# Patient Record
Sex: Male | Born: 1965 | Race: White | Hispanic: No | Marital: Married | Smoking: Former smoker
Health system: Southern US, Community
[De-identification: ages and names within clinical notes are randomized; demographics above are authoritative.]

## PROBLEM LIST (undated history)

## (undated) DIAGNOSIS — M1990 Unspecified osteoarthritis, unspecified site: Secondary | ICD-10-CM

## (undated) HISTORY — PX: EYE SURGERY: SHX253

---

## 2011-12-20 ENCOUNTER — Encounter (HOSPITAL_COMMUNITY): Payer: Self-pay | Admitting: *Deleted

## 2011-12-20 ENCOUNTER — Emergency Department (INDEPENDENT_AMBULATORY_CARE_PROVIDER_SITE_OTHER)
Admission: EM | Admit: 2011-12-20 | Discharge: 2011-12-20 | Disposition: A | Discharge status: Home / Self Care | Payer: Worker's Compensation | Source: Home / Self Care | Attending: Family Medicine | Admitting: Family Medicine

## 2011-12-20 DIAGNOSIS — IMO0002 Reserved for concepts with insufficient information to code with codable children: Secondary | ICD-10-CM

## 2011-12-20 DIAGNOSIS — Z23 Encounter for immunization: Secondary | ICD-10-CM

## 2011-12-20 DIAGNOSIS — S0993XA Unspecified injury of face, initial encounter: Secondary | ICD-10-CM

## 2011-12-20 DIAGNOSIS — T148XXA Other injury of unspecified body region, initial encounter: Secondary | ICD-10-CM

## 2011-12-20 MED ORDER — TETANUS-DIPHTH-ACELL PERTUSSIS 5-2.5-18.5 LF-MCG/0.5 IM SUSP
INTRAMUSCULAR | Status: AC
  Filled 2011-12-20: qty 0.5

## 2011-12-20 MED ORDER — BACITRACIN ZINC 500 UNIT/GM EX OINT: TOPICAL_OINTMENT | Freq: Two times a day (BID) | CUTANEOUS | Status: AC

## 2011-12-20 MED ORDER — TETANUS-DIPHTH-ACELL PERTUSSIS 5-2.5-18.5 LF-MCG/0.5 IM SUSP
0.5000 mL | Freq: Once | INTRAMUSCULAR | Status: AC
  Administered 2011-12-20: 0.5 mL via INTRAMUSCULAR

## 2011-12-20 NOTE — ED Provider Notes (Signed)
History     CSN: [id number]  Arrival date & time 12/20/11  1919   First MD Initiated Contact with Patient 12/20/11 2101      Chief Complaint  Patient presents with  . Facial Laceration    (Consider location/radiation/quality/duration/timing/severity/associated sxs/prior treatment) The history is provided by the patient.   Pt works at [employer], states while putting away items accidentally ran into a metal display, poking into left eye.  Immediate pain and bleeding.  No change in vision, states last tetanus unknown.  Not diabetic, no previous eye trauma, denies cataracts or glaucoma. No floaters or flashing lights No vision loss No blurred vision + eyelid pain, -eye pain No tearing No headache/scalp tenderness Does not wear contacts or glasses, hx of lasix surgery.  History reviewed. No pertinent past medical history.  Past Surgical History  Procedure Date  . Eye surgery     No family history on file.  History  Substance Use Topics  . Smoking status: Not on file  . Smokeless tobacco: Not on file  . Alcohol Use:       Review of Systems  Constitutional: Negative.   Eyes: Positive for pain and redness. Negative for photophobia, discharge, itching and visual disturbance.  Respiratory: Negative.   Cardiovascular: Negative.   Skin: Positive for wound. Negative for color change, pallor and rash.    Allergies  Review of patient's allergies indicates no known allergies.  Home Medications   Current Outpatient Rx  Name Route Sig Dispense Refill  . ASPIRIN 81 MG PO TABS Oral Take 81 mg by mouth daily.    Marland Kitchen BACITRACIN ZINC 500 UNIT/GM EX OINT Topical Apply topically 2 (two) times daily. 120 g 0    BP 125/91  Pulse 72  Temp 98.1 F (36.7 C) (Oral)  Resp 18  SpO2 100%  Physical Exam  Nursing note and vitals reviewed. Constitutional: He is oriented to person, place, and time. Vital signs are normal. He appears well-developed and well-nourished. He is active and  cooperative.  HENT:  Head: Normocephalic.  Eyes: Pupils are equal, round, and reactive to light. Right eye exhibits no discharge, no exudate and no hordeolum. No foreign body present in the right eye. Left eye exhibits no discharge, no exudate and no hordeolum. No foreign body present in the left eye. Right conjunctiva is not injected. Right conjunctiva has no hemorrhage. Left conjunctiva has a hemorrhage. No scleral icterus.         Less than 1/2 cm laceration to left eyelid-hemostatic, ecchymosis noted in medial canthus  Neck: Trachea normal. Neck supple.  Cardiovascular: Normal rate, regular rhythm and normal heart sounds.   Pulmonary/Chest: Effort normal and breath sounds normal.  Lymphadenopathy:    He has no cervical adenopathy.  Neurological: He is alert and oriented to person, place, and time. No cranial nerve deficit or sensory deficit.  Skin: Skin is warm and dry.  Psychiatric: He has a normal mood and affect. His speech is normal and behavior is normal. Judgment and thought content normal. Cognition and memory are normal.    ED Course  Procedures (including critical care time)  Labs Reviewed - No data to display No results found.   1. Laceration   2. Injury of eyelid       MDM  Bacitracin ointment, discussed symptoms to return sooner, return as needed.  Seek follow-up care with an ophthalmologist within 1-2 weeks if the condition is not resolved completely with prescribed management.  Johnsie Kindred, NP 12/20/11 2124

## 2011-12-20 NOTE — ED Notes (Signed)
Pt  Reports  Sustaining a  lacertion  To  l  Eye  Lid  With    Some    subcunctival  hemmorage      Present  Pt  Was  strucck  By  Metal  While  At  Work    At  [employer]  Has  Subsided     No  Loss of  concoussness

## 2011-12-20 NOTE — ED Notes (Signed)
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

## 2011-12-25 NOTE — ED Provider Notes (Signed)
Medical screening examination/treatment/procedure(s) were performed by resident physician or non-physician practitioner and as supervising physician I was immediately available for consultation/collaboration.   Barkley Bruns MD.    Linna Hoff, MD 12/25/11 1038

## 2015-04-14 ENCOUNTER — Ambulatory Visit (INDEPENDENT_AMBULATORY_CARE_PROVIDER_SITE_OTHER): Payer: BLUE CROSS/BLUE SHIELD

## 2015-04-14 ENCOUNTER — Ambulatory Visit (INDEPENDENT_AMBULATORY_CARE_PROVIDER_SITE_OTHER): Payer: BLUE CROSS/BLUE SHIELD | Admitting: Family Medicine

## 2015-04-14 VITALS — BP 132/90 | HR 93 | Temp 98.1°F | Resp 18 | Ht 64.0 in | Wt 166.4 lb

## 2015-04-14 DIAGNOSIS — M545 Low back pain, unspecified: Secondary | ICD-10-CM

## 2015-04-14 DIAGNOSIS — M546 Pain in thoracic spine: Secondary | ICD-10-CM | POA: Diagnosis not present

## 2015-04-14 DIAGNOSIS — S80869A Insect bite (nonvenomous), unspecified lower leg, initial encounter: Secondary | ICD-10-CM

## 2015-04-14 DIAGNOSIS — S80862A Insect bite (nonvenomous), left lower leg, initial encounter: Secondary | ICD-10-CM

## 2015-04-14 DIAGNOSIS — W57XXXA Bitten or stung by nonvenomous insect and other nonvenomous arthropods, initial encounter: Secondary | ICD-10-CM

## 2015-04-14 MED ORDER — TRIAMCINOLONE ACETONIDE 0.1 % EX CREA: 1.0000 "application " | TOPICAL_CREAM | Freq: Two times a day (BID) | CUTANEOUS | Status: DC

## 2015-04-14 NOTE — Patient Instructions (Addendum)
Use the triamcinolone cream twice daily on the rash. If it is not doing better over the next 10 days call back and I will call in some prednisone (cortisone) for you  Take acetaminophen (Tylenol) 500 mg 2 pills twice daily as needed for back pain.  If pain is too bad you can take Aleve (naproxen) 220 mg 2 pills twice daily but I would not recommend doing so on a regular basis. Take these with food.  Referral is being made to physical therapy  Return if needed.  Back Exercises The following exercises strengthen the muscles that help to support the back. They also help to keep the lower back flexible. Doing these exercises can help to prevent back pain or lessen existing pain. If you have back pain or discomfort, try doing these exercises 2-3 times each day or as told by your health care provider. When the pain goes away, do them once each day, but increase the number of times that you repeat the steps for each exercise (do more repetitions). If you do not have back pain or discomfort, do these exercises once each day or as told by your health care provider. EXERCISES Single Knee to Chest Repeat these steps 3-5 times for each leg: 1. Lie on your back on a firm bed or the floor with your legs extended. 2. Bring one knee to your chest. Your other leg should stay extended and in contact with the floor. 3. Hold your knee in place by grabbing your knee or thigh. 4. Pull on your knee until you feel a gentle stretch in your lower back. 5. Hold the stretch for 10-30 seconds. 6. Slowly release and straighten your leg. Pelvic Tilt Repeat these steps 5-10 times: 1. Lie on your back on a firm bed or the floor with your legs extended. 2. Bend your knees so they are pointing toward the ceiling and your feet are flat on the floor. 3. Tighten your lower abdominal muscles to press your lower back against the floor. This motion will tilt your pelvis so your tailbone points up toward the ceiling instead of  pointing to your feet or the floor. 4. With gentle tension and even breathing, hold this position for 5-10 seconds. Cat-Cow Repeat these steps until your lower back becomes more flexible: 1. Get into a hands-and-knees position on a firm surface. Keep your hands under your shoulders, and keep your knees under your hips. You may place padding under your knees for comfort. 2. Let your head hang down, and point your tailbone toward the floor so your lower back becomes rounded like the back of a cat. 3. Hold this position for 5 seconds. 4. Slowly lift your head and point your tailbone up toward the ceiling so your back forms a sagging arch like the back of a cow. 5. Hold this position for 5 seconds. Press-Ups Repeat these steps 5-10 times: 1. Lie on your abdomen (face-down) on the floor. 2. Place your palms near your head, about shoulder-width apart. 3. While you keep your back as relaxed as possible and keep your hips on the floor, slowly straighten your arms to raise the top half of your body and lift your shoulders. Do not use your back muscles to raise your upper torso. You may adjust the placement of your hands to make yourself more comfortable. 4. Hold this position for 5 seconds while you keep your back relaxed. 5. Slowly return to lying flat on the floor. Bridges Repeat these steps 10  times: 1. Lie on your back on a firm surface. 2. Bend your knees so they are pointing toward the ceiling and your feet are flat on the floor. 3. Tighten your buttocks muscles and lift your buttocks off of the floor until your waist is at almost the same height as your knees. You should feel the muscles working in your buttocks and the back of your thighs. If you do not feel these muscles, slide your feet 1-2 inches farther away from your buttocks. 4. Hold this position for 3-5 seconds. 5. Slowly lower your hips to the starting position, and allow your buttocks muscles to relax completely. If this exercise is  too easy, try doing it with your arms crossed over your chest. Abdominal Crunches Repeat these steps 5-10 times: 1. Lie on your back on a firm bed or the floor with your legs extended. 2. Bend your knees so they are pointing toward the ceiling and your feet are flat on the floor. 3. Cross your arms over your chest. 4. Tip your chin slightly toward your chest without bending your neck. 5. Tighten your abdominal muscles and slowly raise your trunk (torso) high enough to lift your shoulder blades a tiny bit off of the floor. Avoid raising your torso higher than that, because it can put too much stress on your low back and it does not help to strengthen your abdominal muscles. 6. Slowly return to your starting position. Back Lifts Repeat these steps 5-10 times: 1. Lie on your abdomen (face-down) with your arms at your sides, and rest your forehead on the floor. 2. Tighten the muscles in your legs and your buttocks. 3. Slowly lift your chest off of the floor while you keep your hips pressed to the floor. Keep the back of your head in line with the curve in your back. Your eyes should be looking at the floor. 4. Hold this position for 3-5 seconds. 5. Slowly return to your starting position. SEEK MEDICAL CARE IF:  Your back pain or discomfort gets much worse when you do an exercise.  Your back pain or discomfort does not lessen within 2 hours after you exercise. If you have any of these problems, stop doing these exercises right away. Do not do them again unless your health care provider says that you can. SEEK IMMEDIATE MEDICAL CARE IF:  You develop sudden, severe back pain. If this happens, stop doing the exercises right away. Do not do them again unless your health care provider says that you can.   This information is not intended to replace advice given to you by your health care provider. Make sure you discuss any questions you have with your health care provider.   Document Released:  05/06/2004 Document Revised: 12/18/2014 Document Reviewed: 05/23/2014 Elsevier Interactive Patient Education [employer]2016 [employer]

## 2015-04-14 NOTE — Progress Notes (Signed)
Patient ID: James Cain, male    DOB: [birth year]  Age: 50 y.o. MRN: [id number]  Chief Complaint  Patient presents with  . Back Pain    chronic  . flea bites    on left leg x 3 months (has indoor & outdoor cats)    Subjective:   Patient is here for a couple of things. He had some cats, and a mother And her kittens were using one-bedroom. At the kittens got big enough that they came out when the patient went into that room he was attacked by fleas and got 100s of bites on his lower legs. He's been itching since then it is been very slow resolving.  The second problem is a lifelong issue of back pain ever since playing sports in high school. He hurts now days up and down the spine from the neck to the pelvis. He frequently has to pop his back. He used to go to a chiropractor that his mother worked for.. He says popping makes it feel better, but then it immediately starts hurting again. He has done jobs it is listed a lot out over the years, and has had a number of instances where he is straining his back more. He takes some Tylenol and aspirin for it.  Current allergies, medications, problem list, past/family and social histories reviewed.  Objective:  BP 132/90 mmHg  Pulse 93  Temp(Src) 98.1 F (36.7 C) (Oral)  Resp 18  Ht 5\' 4"  (1.626 m)  Wt 166 lb 6 oz (75.467 kg)  BMI 28.54 kg/m2  SpO2 99%  No major acute distress. Good range of motion of his spine. Not optimally tender down his spine. Extensive flea bites, 100s of them, below his knees on both legs.  Assessment & Plan:   Assessment: 1. Midline low back pain without sciatica   2. Midline thoracic back pain   3. Flea bite of lower leg, unspecified laterality, initial encounter       Plan: Treat symptomatically. Get physical therapy. Discussed his x-rays with him. If the cream does not help his legs we will give him a taper of prednisone. [facility] reading (PRIMARY) by  Dr. Alwyn Ren Arthritic spurring of thoracic spine. Lumbar  spine and disc spaces looked pretty good..    Orders Placed This Encounter  Procedures  . DG Lumbar Spine 2-3 Views    Order Specific Question:  Reason for Exam (SYMPTOM  OR DIAGNOSIS REQUIRED)    Answer:  back pain    Order Specific Question:  Preferred imaging location?    Answer:  External  . DG Thoracic Spine 2 View    Standing Status: Future     Number of Occurrences: 1     Standing Expiration Date: 04/13/2016    Order Specific Question:  Reason for Exam (SYMPTOM  OR DIAGNOSIS REQUIRED)    Answer:  back pain    Order Specific Question:  Preferred imaging location?    Answer:  External  . Ambulatory referral to Physical Therapy    Referral Priority:  Routine    Referral Type:  Physical Medicine    Referral Reason:  Specialty Services Required    Requested Specialty:  Physical Therapy    Number of Visits Requested:  1    Meds ordered this encounter  Medications  . triamcinolone cream (KENALOG) 0.1 %    Sig: Apply 1 application topically 2 (two) times daily.    Dispense:  80 g    Refill:  0  Patient Instructions  Use the triamcinolone cream twice daily on the rash. If it is not doing better over the next 10 days call back and I will call in some prednisone (cortisone) for you  Take acetaminophen (Tylenol) 500 mg 2 pills twice daily as needed for back pain.  If pain is too bad you can take Aleve (naproxen) 220 mg 2 pills twice daily but I would not recommend doing so on a regular basis. Take these with food.  Referral is being made to physical therapy  Return if needed.  Back Exercises The following exercises strengthen the muscles that help to support the back. They also help to keep the lower back flexible. Doing these exercises can help to prevent back pain or lessen existing pain. If you have back pain or discomfort, try doing these exercises 2-3 times each day or as told by your health care provider. When the pain goes away, do them once each day, but  increase the number of times that you repeat the steps for each exercise (do more repetitions). If you do not have back pain or discomfort, do these exercises once each day or as told by your health care provider. EXERCISES Single Knee to Chest Repeat these steps 3-5 times for each leg: 1. Lie on your back on a firm bed or the floor with your legs extended. 2. Bring one knee to your chest. Your other leg should stay extended and in contact with the floor. 3. Hold your knee in place by grabbing your knee or thigh. 4. Pull on your knee until you feel a gentle stretch in your lower back. 5. Hold the stretch for 10-30 seconds. 6. Slowly release and straighten your leg. Pelvic Tilt Repeat these steps 5-10 times: 1. Lie on your back on a firm bed or the floor with your legs extended. 2. Bend your knees so they are pointing toward the ceiling and your feet are flat on the floor. 3. Tighten your lower abdominal muscles to press your lower back against the floor. This motion will tilt your pelvis so your tailbone points up toward the ceiling instead of pointing to your feet or the floor. 4. With gentle tension and even breathing, hold this position for 5-10 seconds. Cat-Cow Repeat these steps until your lower back becomes more flexible: 1. Get into a hands-and-knees position on a firm surface. Keep your hands under your shoulders, and keep your knees under your hips. You may place padding under your knees for comfort. 2. Let your head hang down, and point your tailbone toward the floor so your lower back becomes rounded like the back of a cat. 3. Hold this position for 5 seconds. 4. Slowly lift your head and point your tailbone up toward the ceiling so your back forms a sagging arch like the back of a cow. 5. Hold this position for 5 seconds. Press-Ups Repeat these steps 5-10 times: 1. Lie on your abdomen (face-down) on the floor. 2. Place your palms near your head, about shoulder-width  apart. 3. While you keep your back as relaxed as possible and keep your hips on the floor, slowly straighten your arms to raise the top half of your body and lift your shoulders. Do not use your back muscles to raise your upper torso. You may adjust the placement of your hands to make yourself more comfortable. 4. Hold this position for 5 seconds while you keep your back relaxed. 5. Slowly return to lying flat on the floor. Henreitta LeberBridges  Repeat these steps 10 times: 1. Lie on your back on a firm surface. 2. Bend your knees so they are pointing toward the ceiling and your feet are flat on the floor. 3. Tighten your buttocks muscles and lift your buttocks off of the floor until your waist is at almost the same height as your knees. You should feel the muscles working in your buttocks and the back of your thighs. If you do not feel these muscles, slide your feet 1-2 inches farther away from your buttocks. 4. Hold this position for 3-5 seconds. 5. Slowly lower your hips to the starting position, and allow your buttocks muscles to relax completely. If this exercise is too easy, try doing it with your arms crossed over your chest. Abdominal Crunches Repeat these steps 5-10 times: 1. Lie on your back on a firm bed or the floor with your legs extended. 2. Bend your knees so they are pointing toward the ceiling and your feet are flat on the floor. 3. Cross your arms over your chest. 4. Tip your chin slightly toward your chest without bending your neck. 5. Tighten your abdominal muscles and slowly raise your trunk (torso) high enough to lift your shoulder blades a tiny bit off of the floor. Avoid raising your torso higher than that, because it can put too much stress on your low back and it does not help to strengthen your abdominal muscles. 6. Slowly return to your starting position. Back Lifts Repeat these steps 5-10 times: 1. Lie on your abdomen (face-down) with your arms at your sides, and rest your  forehead on the floor. 2. Tighten the muscles in your legs and your buttocks. 3. Slowly lift your chest off of the floor while you keep your hips pressed to the floor. Keep the back of your head in line with the curve in your back. Your eyes should be looking at the floor. 4. Hold this position for 3-5 seconds. 5. Slowly return to your starting position. SEEK MEDICAL CARE IF:  Your back pain or discomfort gets much worse when you do an exercise.  Your back pain or discomfort does not lessen within 2 hours after you exercise. If you have any of these problems, stop doing these exercises right away. Do not do them again unless your health care provider says that you can. SEEK IMMEDIATE MEDICAL CARE IF:  You develop sudden, severe back pain. If this happens, stop doing the exercises right away. Do not do them again unless your health care provider says that you can.   This information is not intended to replace advice given to you by your health care provider. Make sure you discuss any questions you have with your health care provider.   Document Released: 05/06/2004 Document Revised: 12/18/2014 Document Reviewed: 05/23/2014 Elsevier Interactive Patient Education [employer]      Return if symptoms worsen or fail to improve.   HOPPER,DAVID, MD 04/14/2015

## 2015-04-15 ENCOUNTER — Encounter: Payer: Self-pay | Admitting: Family Medicine

## 2015-04-19 ENCOUNTER — Encounter: Payer: Self-pay | Admitting: Family Medicine

## 2015-05-02 ENCOUNTER — Encounter: Payer: Self-pay | Admitting: *Deleted

## 2015-05-23 ENCOUNTER — Ambulatory Visit: Payer: BLUE CROSS/BLUE SHIELD | Admitting: Physical Therapy

## 2015-12-30 ENCOUNTER — Emergency Department (HOSPITAL_COMMUNITY)
Admission: EM | Admit: 2015-12-30 | Discharge: 2015-12-30 | Disposition: A | Discharge status: Home / Self Care | Payer: Worker's Compensation | Attending: Emergency Medicine | Admitting: Emergency Medicine

## 2015-12-30 ENCOUNTER — Encounter (HOSPITAL_COMMUNITY): Payer: Self-pay | Admitting: Emergency Medicine

## 2015-12-30 ENCOUNTER — Emergency Department (HOSPITAL_COMMUNITY): Payer: Worker's Compensation

## 2015-12-30 DIAGNOSIS — W11XXXA Fall on and from ladder, initial encounter: Secondary | ICD-10-CM | POA: Insufficient documentation

## 2015-12-30 DIAGNOSIS — Z87891 Personal history of nicotine dependence: Secondary | ICD-10-CM | POA: Insufficient documentation

## 2015-12-30 DIAGNOSIS — S52502A Unspecified fracture of the lower end of left radius, initial encounter for closed fracture: Secondary | ICD-10-CM

## 2015-12-30 DIAGNOSIS — S52572A Other intraarticular fracture of lower end of left radius, initial encounter for closed fracture: Secondary | ICD-10-CM | POA: Diagnosis not present

## 2015-12-30 DIAGNOSIS — S52042A Displaced fracture of coronoid process of left ulna, initial encounter for closed fracture: Secondary | ICD-10-CM | POA: Insufficient documentation

## 2015-12-30 DIAGNOSIS — Y999 Unspecified external cause status: Secondary | ICD-10-CM | POA: Insufficient documentation

## 2015-12-30 DIAGNOSIS — Z79899 Other long term (current) drug therapy: Secondary | ICD-10-CM | POA: Diagnosis not present

## 2015-12-30 DIAGNOSIS — Y9389 Activity, other specified: Secondary | ICD-10-CM | POA: Diagnosis not present

## 2015-12-30 DIAGNOSIS — Y929 Unspecified place or not applicable: Secondary | ICD-10-CM | POA: Diagnosis not present

## 2015-12-30 DIAGNOSIS — S6992XA Unspecified injury of left wrist, hand and finger(s), initial encounter: Secondary | ICD-10-CM | POA: Diagnosis present

## 2015-12-30 DIAGNOSIS — W19XXXA Unspecified fall, initial encounter: Secondary | ICD-10-CM

## 2015-12-30 HISTORY — DX: Unspecified osteoarthritis, unspecified site: M19.90

## 2015-12-30 MED ORDER — MORPHINE SULFATE (PF) 4 MG/ML IV SOLN
4.0000 mg | Freq: Once | INTRAVENOUS | Status: AC
  Administered 2015-12-30: 4 mg via INTRAVENOUS
  Filled 2015-12-30: qty 1

## 2015-12-30 MED ORDER — OXYCODONE-ACETAMINOPHEN 5-325 MG PO TABS: 1.0000 | ORAL_TABLET | ORAL | 0 refills | Status: DC | PRN

## 2015-12-30 NOTE — ED Notes (Signed)
Called ortho for splint placement 

## 2015-12-30 NOTE — ED Triage Notes (Signed)
Per EMS, patient fell from 5 ft step ladder. Patient is complaining of left arm pain. Denies hitting head. Patient received 100 mcg of fentanyl. Patient's pain is now 8/10

## 2015-12-30 NOTE — ED Notes (Signed)
Ortho tech at bedside 

## 2015-12-30 NOTE — Discharge Instructions (Signed)
Your xrays show a  1. Comminuted and mildly displaced intra-articular fracture of the coronoid process region of the ulna. 2. Comminuted intra-articular fracture involving the distal radius.  We have placed you in a splint.  Please follow up with hand surgery.  You may take Percocet every 4-6 hours as needed for pain.  You may also take motrin.  Return if you experience numbness, color changes, increased pain, or any new symptoms.

## 2015-12-30 NOTE — ED Notes (Signed)
Patient transported to X-ray 

## 2015-12-30 NOTE — ED Notes (Signed)
[facility] Expected date:  Expected time:  Means of arrival:  Comments: EMS 50 yo, m fall

## 2015-12-30 NOTE — ED Notes (Signed)
Awaiting response from worker's comp.

## 2015-12-30 NOTE — ED Provider Notes (Signed)
[facility]-EMERGENCY DEPT Provider Note   CSN: [id number] Arrival date & time: 12/30/15  1739     History   Chief Complaint Chief Complaint  Patient presents with  . Fall  . Arm Pain    HPI James Cain is a 50 y.o. male.  HPI   James Cain is a 50 y.o. male with PMH significant for arthritis who presents with mechanical fall just PTA and now left elbow and wrist pain and swelling. Patient states he was standing on a 5 ft step ladder when he lost his balance and fell landing on his left arm.  Denies head injury or LOC.  Denies neck pain or pain elsewhere.  He is not anticoagulated.  He received 100 mg Fentanyl with some relief of pain.  No numbness or weakness.    Past Medical History:  Diagnosis Date  . Arthritis     There are no active problems to display for this patient.   Past Surgical History:  Procedure Laterality Date  . EYE SURGERY         Home Medications    Prior to Admission medications   Medication Sig Start Date End Date Taking? Authorizing Provider  acetaminophen (TYLENOL) 500 MG tablet Take 1,000 mg by mouth 2 (two) times daily.   Yes Historical Provider, MD  loratadine (CLARITIN) 10 MG tablet Take 10 mg by mouth daily.   Yes Historical Provider, MD  Misc Natural Products (GLUCOSAMINE CHOND DOUBLE STR) TABS Take 1 tablet by mouth daily.   Yes Historical Provider, MD  Multiple Vitamins-Minerals (MULTIVITAMIN ADULT PO) Take 1 tablet by mouth daily.   Yes Historical Provider, MD  oxyCODONE-acetaminophen (PERCOCET/ROXICET) 5-325 MG tablet Take 1-2 tablets by mouth every 4 (four) hours as needed for severe pain. 12/30/15   Cheri Fowler, PA-C  triamcinolone cream (KENALOG) 0.1 % Apply 1 application topically 2 (two) times daily. Patient not taking: Reported on 12/30/2015 04/14/15   Peyton Najjar, MD    Family History Family History  Problem Relation Age of Onset  . Stroke Mother   . Heart disease Paternal Grandmother   . Hyperlipidemia Paternal  Grandmother     Social History Social History  Substance Use Topics  . Smoking status: Former Games developer  . Smokeless tobacco: Never Used  . Alcohol use No     Allergies   Review of patient's allergies indicates no known allergies.   Review of Systems Review of Systems All other systems negative unless otherwise stated in HPI   Physical Exam Updated Vital Signs BP 116/79 (BP Location: Right Arm)   Pulse 79   Temp 98.1 F (36.7 C) (Oral)   Resp 18   Ht 5\' 9"  (1.753 m)   Wt 75.3 kg   SpO2 98%   BMI 24.51 kg/m   Physical Exam  Constitutional: He is oriented to person, place, and time. He appears well-developed and well-nourished.  Non-toxic appearance. He does not have a sickly appearance. He does not appear ill.  HENT:  Head: Normocephalic and atraumatic.  Mouth/Throat: Oropharynx is clear and moist.  No signs of trauma.   Eyes: Conjunctivae are normal. Pupils are equal, round, and reactive to light.  Neck: Normal range of motion. Neck supple.  No cervical midline tenderness.   Cardiovascular: Normal rate and regular rhythm.   Pulmonary/Chest: Effort normal and breath sounds normal. No accessory muscle usage or stridor. No respiratory distress. He has no wheezes. He has no rhonchi. He has no rales.  Abdominal: Soft.  Bowel sounds are normal. He exhibits no distension. There is no tenderness.  Musculoskeletal: He exhibits tenderness.       Left shoulder: Normal.       Left elbow: He exhibits decreased range of motion (in flexion) and swelling. He exhibits no deformity. Tenderness found. No medial epicondyle, no lateral epicondyle and no olecranon process tenderness noted.       Left wrist: He exhibits tenderness and swelling. He exhibits normal range of motion, no bony tenderness and no deformity.  No t/l/s midline tenderness. Compartments are soft and compressible.  Lymphadenopathy:    He has no cervical adenopathy.  Neurological: He is alert and oriented to person,  place, and time.  PMS intact.   Skin: Skin is warm and dry.  Psychiatric: He has a normal mood and affect. His behavior is normal.     ED Treatments / Results  Labs (all labs ordered are listed, but only abnormal results are displayed) Labs Reviewed - No data to display  EKG  EKG Interpretation None       Radiology Dg Elbow Complete Left  Result Date: 12/30/2015 CLINICAL DATA:  Larey SeatFell off of a step ladder today. Injured left all arm. EXAM: LEFT ELBOW - COMPLETE 3+ VIEW; LEFT WRIST - COMPLETE 3+ VIEW COMPARISON:  None. FINDINGS: Left elbow: There is a comminuted and mildly displaced intra-articular fracture involving the coronoid process region of the ulna. This is likely an impaction injury from the trochlea. The capitellum at radial head are intact. No olecranon fracture. No dislocation. Left wrist: There is a comminuted intra-articular fracture involving the distal radius. There are horizontal and vertical components. The ulna is intact. No carpal bone fractures are identified. The metacarpals are intact. IMPRESSION: 1. Comminuted and mildly displaced intra-articular fracture of the coronoid process region of the ulna. 2. Comminuted intra-articular fracture involving the distal radius. Electronically Signed   By: Rudie MeyerP.  Gallerani M.D.   On: 12/30/2015 19:03   Dg Wrist Complete Left  Result Date: 12/30/2015 CLINICAL DATA:  Larey SeatFell off of a step ladder today. Injured left all arm. EXAM: LEFT ELBOW - COMPLETE 3+ VIEW; LEFT WRIST - COMPLETE 3+ VIEW COMPARISON:  None. FINDINGS: Left elbow: There is a comminuted and mildly displaced intra-articular fracture involving the coronoid process region of the ulna. This is likely an impaction injury from the trochlea. The capitellum at radial head are intact. No olecranon fracture. No dislocation. Left wrist: There is a comminuted intra-articular fracture involving the distal radius. There are horizontal and vertical components. The ulna is intact. No carpal  bone fractures are identified. The metacarpals are intact. IMPRESSION: 1. Comminuted and mildly displaced intra-articular fracture of the coronoid process region of the ulna. 2. Comminuted intra-articular fracture involving the distal radius. Electronically Signed   By: Rudie MeyerP.  Gallerani M.D.   On: 12/30/2015 19:03    Procedures Procedures (including critical care time)  Medications Ordered in ED Medications  morphine 4 MG/ML injection 4 mg (4 mg Intravenous Given 12/30/15 1853)     Initial Impression / Assessment and Plan / ED Course  I have reviewed the triage vital signs and the nursing notes.  Pertinent labs & imaging results that were available during my care of the patient were reviewed by me and considered in my medical decision making (see chart for details).  Clinical Course   Patient presents with mechanical fall, landing on his left elbow and wrist. No head injury or loss of consciousness. He denies any numbness or weakness. On  exam, swelling about the left elbow and left wrist. No obvious deformities.He is neurovascularly intact. Compartments are soft and compressible.  Plain films showed a coronoid process of the ulna as well as distal radius fracture. Patient was placed in a sugar tong splint and discharged home with Percocet along with hand surgery follow-up. Return precautions discussed. Patient agrees and acknowledges the above plan for discharge.  Case has been discussed with Dr. Clydene Pugh who agrees with the above plan for discharge.    Final Clinical Impressions(s) / ED Diagnoses   Final diagnoses:  Fall, initial encounter  Fracture of ulna, coronoid process, left, closed, initial encounter  Distal radial fracture, left, closed, initial encounter    New Prescriptions New Prescriptions   OXYCODONE-ACETAMINOPHEN (PERCOCET/ROXICET) 5-325 MG TABLET    Take 1-2 tablets by mouth every 4 (four) hours as needed for severe pain.     Cheri Fowler, PA-C 12/30/15 2059    Lyndal Pulley, MD 12/31/15 [phone]

## 2016-07-17 ENCOUNTER — Ambulatory Visit: Payer: Managed Care, Other (non HMO)

## 2016-07-24 ENCOUNTER — Ambulatory Visit (INDEPENDENT_AMBULATORY_CARE_PROVIDER_SITE_OTHER): Payer: BLUE CROSS/BLUE SHIELD | Admitting: Family Medicine

## 2016-07-24 VITALS — BP 138/94 | HR 90 | Temp 98.6°F | Resp 18 | Ht 64.17 in | Wt 162.6 lb

## 2016-07-24 DIAGNOSIS — W57XXXA Bitten or stung by nonvenomous insect and other nonvenomous arthropods, initial encounter: Secondary | ICD-10-CM | POA: Diagnosis not present

## 2016-07-24 DIAGNOSIS — M545 Low back pain, unspecified: Secondary | ICD-10-CM

## 2016-07-24 DIAGNOSIS — G8929 Other chronic pain: Secondary | ICD-10-CM

## 2016-07-24 DIAGNOSIS — I872 Venous insufficiency (chronic) (peripheral): Secondary | ICD-10-CM | POA: Diagnosis not present

## 2016-07-24 DIAGNOSIS — S80869A Insect bite (nonvenomous), unspecified lower leg, initial encounter: Secondary | ICD-10-CM | POA: Diagnosis not present

## 2016-07-24 DIAGNOSIS — Z0000 Encounter for general adult medical examination without abnormal findings: Secondary | ICD-10-CM

## 2016-07-24 LAB — POCT URINALYSIS DIP (MANUAL ENTRY)
BILIRUBIN UA: NEGATIVE mg/dL
Bilirubin, UA: NEGATIVE
Glucose, UA: NEGATIVE mg/dL
Leukocytes, UA: NEGATIVE
Nitrite, UA: NEGATIVE
PH UA: 5 (ref 5.0–8.0)
PROTEIN UA: NEGATIVE mg/dL
RBC UA: NEGATIVE
SPEC GRAV UA: 1.015 (ref 1.010–1.025)
UROBILINOGEN UA: 0.2 U/dL

## 2016-07-24 LAB — POC HEMOCCULT BLD/STL (OFFICE/1-CARD/DIAGNOSTIC): Fecal Occult Blood, POC: NEGATIVE

## 2016-07-24 MED ORDER — TRIAMCINOLONE ACETONIDE 0.1 % EX CREA: 1.0000 "application " | TOPICAL_CREAM | Freq: Two times a day (BID) | CUTANEOUS | 1 refills | Status: DC

## 2016-07-24 NOTE — Addendum Note (Signed)
Addended by: Baldwin Crown D on: 07/24/2016 11:03 AM   Modules accepted: Orders

## 2016-07-24 NOTE — Progress Notes (Signed)
Patient ID: James Cain, male    DOB: [birth year]  Age: 51 y.o. MRN: [id number]  Chief Complaint  Patient presents with  . Annual Exam    Subjective:   51 year old man here for his annual physical examination. He just felt like it was time for an examination.  Past history: Generally has been healthy. No major illnesses or operations. Allergies: None Regular medications: Takes OTC acetaminophen for his back when necessary  Family history: Parents are living. Mother has had some heart issues, follow healthy. No major foraminal diseases.   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 He is married. He had a first wife, then a girlfriend in between and then this wife for many years. No sexual activity outside the context of marriage. Is been a number of years ago and he got married that he got tested and feels like he has no risk of STDs. He did have a broken arm last year and that limited him so that he is now working regularly again in the warehouse. He does have chronic problems with back hurting him which limits his work a little bit and he would like to be doing something differently. 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  Review of systems: Constitutional: Unremarkable HEENT: Unremarkable Cardiovascular: Unremarkable Respiratory: Unremarkable Gastrointestinal: Unremarkable Genitourinary: Unremarkable No sexual problems Dermatologic: Still gets a little rash on his ankles which he uses some-year-old cream on times. Neurologic: Gets a little numbness in his left arm still emesis injury. Psychiatric: Unremarkable         Current allergies, medications, problem list, past/family and social histories reviewed.  Objective:  BP (!) 138/94   Pulse 90   Temp 98.6 F (37 C) (Oral)   Resp 18   Ht 5' 4.17" (1.63 m)    Wt 162 lb 9.6 oz (73.8 kg)   SpO2 95%   BMI 27.76 kg/m   Pleasant man in no acute distress. HEENT: Normal. TMs normal. He says he cleans his ears compulsively all the time. Eyes PERRLA. Fundi benign. Throat clear. Neck supple without nodes or thyromegaly. No carotid bruits. Chest is clear to auscultation. Heart regular without murmurs gallops or arrhythmias. Abdomen soft without organomegaly masses or tenderness. Normal male external genitalia with testes descended. No hernias. Digital rectal exam his prostate gland be normal in shape and contour with no lesions. Extremities unremarkable. Skin on the unremarkable except for little stasis dermatitis appearance on his medial aspects of both ankles. He does have some rather large tattoos on both arms.  Assessment & Plan:   Assessment: 1. Annual physical exam   2. Chronic low back pain without sciatica, unspecified back pain laterality   3. Stasis dermatitis of both legs   4. Flea bite of lower leg, unspecified laterality, initial encounter       Plan: Check basic labs. Discussed pros and cons of PSA testing and he decided to get it. Does not feel like he needs to be tested for STDs.  Orders Placed This Encounter  Procedures  . COMPLETE METABOLIC PANEL WITH GFR  . Lipid panel  . CBC  . PSA  . Hepatitis C Ab Reflex HCV RNA, QUANT  . TSH  . POCT urinalysis dipstick  . POC Hemoccult Bld/Stl (1-Cd Office Dx)    Meds ordered this encounter  Medications  .  triamcinolone cream (KENALOG) 0.1 %    Sig: Apply 1 application topically 2 (two) times daily.    Dispense:  80 g    Refill:  1         Patient Instructions   Continue to try to get regular exercise for your back and arm. If the arm numbness gets worse get rechecked.  You can safely use the cream, triamcinolone, on the ankles if needed. If the rash is getting worse or more irritating we will send you to a dermatologist.  Recommend getting a physical about  annually.  Return as needed    IF you received an x-ray today, you will receive an invoice from [facility]. Please contact [facility] at [phone] with questions or concerns regarding your invoice.   IF you received labwork today, you will receive an invoice from [place]. Please contact [facility] at [phone] with questions or concerns regarding your invoice.   Our billing staff will not be able to assist you with questions regarding bills from these companies.  You will be contacted with the lab results as soon as they are available. The fastest way to get your results is to activate your My Chart account. Instructions are located on the last page of this paperwork. If you have not heard from [place] regarding the results in 2 weeks, please contact this office.         Return in about 1 year (around 07/24/2017).   HOPPER,DAVID, MD 07/24/2016

## 2016-07-24 NOTE — Patient Instructions (Addendum)
Continue to try to get regular exercise for your back and arm. If the arm numbness gets worse get rechecked.  You can safely use the cream, triamcinolone, on the ankles if needed. If the rash is getting worse or more irritating we will send you to a dermatologist.  Recommend getting a physical about annually.  Return as needed    IF you received an x-ray today, you will receive an invoice from [facility]. Please contact [facility] at [phone] with questions or concerns regarding your invoice.   IF you received labwork today, you will receive an invoice from [place]. Please contact [facility] at [phone] with questions or concerns regarding your invoice.   Our billing staff will not be able to assist you with questions regarding bills from these companies.  You will be contacted with the lab results as soon as they are available. The fastest way to get your results is to activate your My Chart account. Instructions are located on the last page of this paperwork. If you have not heard from [place] regarding the results in 2 weeks, please contact this office.

## 2016-07-25 LAB — LIPID PANEL
CHOL/HDL RATIO: 4.8 ratio (ref 0.0–5.0)
CHOLESTEROL TOTAL: 197 mg/dL (ref 100–199)
HDL: 41 mg/dL (ref >39)
LDL Calculated: 117 mg/dL — ABNORMAL HIGH (ref 0–99)
Triglycerides: 195 mg/dL — ABNORMAL HIGH (ref 0–149)
VLDL Cholesterol Cal: 39 mg/dL (ref 5–40)

## 2016-07-25 LAB — CBC
HEMATOCRIT: 42.7 % (ref 37.5–51.0)
HEMOGLOBIN: 14.5 g/dL (ref 13.0–17.7)
MCH: 30.1 pg (ref 26.6–33.0)
MCHC: 34 g/dL (ref 31.5–35.7)
MCV: 89 fL (ref 79–97)
Platelets: 224 10*3/uL (ref 150–379)
RBC: 4.81 x10E6/uL (ref 4.14–5.80)
RDW: 14.1 % (ref 12.3–15.4)
WBC: 6.4 10*3/uL (ref 3.4–10.8)

## 2016-07-25 LAB — TSH: TSH: 1.7 u[IU]/mL (ref 0.450–4.500)

## 2016-07-25 LAB — PSA: Prostate Specific Ag, Serum: 1.8 ng/mL (ref 0.0–4.0)

## 2016-08-03 LAB — COMPREHENSIVE METABOLIC PANEL

## 2016-08-03 LAB — HCV REALTIME ABBOTT

## 2016-08-24 ENCOUNTER — Telehealth: Payer: Self-pay

## 2016-08-24 DIAGNOSIS — Z0000 Encounter for general adult medical examination without abnormal findings: Secondary | ICD-10-CM

## 2016-08-24 NOTE — Telephone Encounter (Signed)
PATIENT STATES HE HAD HIS ANNUAL PHYSICAL DONE WITH DR. HOPPER ON July 24, 2016 AND HE HAS NEVER GOTTEN HIS LAB RESULTS. HE SAID HE CALLED AND LEFT A MESSAGE THIS PAST Saturday (08/21/16) BUT I DIDN'T SEE ONE IN HIS CHART. PLEASE CALL HIM WITH THOSE RESULTS. BEST PHONE [phone] (HOME) PHARMACY CHOICE IS [facility] ON [address]. MBC

## 2016-08-25 NOTE — Telephone Encounter (Signed)
Dr. Alwyn RenHopper, Please post results

## 2016-08-27 NOTE — Telephone Encounter (Signed)
Please call patient back.   My apologies that this slipped by me.      Labs were all good except for a mild elevation of the triglycerides, which do not require any major attention.  Avoid excessive fried and fatty foods, and try to get regular exercise.  Please send him a copy of things.   However, it appears that somehow the CMP did not get run.  Please schedule it to be run on a lab only visit sometime at his convenience.  It appears to have been an error on our part, and I think he should be able to come get it drawn without having to pay a co-pay if at all possible.

## 2016-08-27 NOTE — Telephone Encounter (Signed)
Pt advised.

## 2016-09-11 ENCOUNTER — Other Ambulatory Visit: Payer: BLUE CROSS/BLUE SHIELD | Admitting: Family Medicine

## 2016-09-11 DIAGNOSIS — Z0000 Encounter for general adult medical examination without abnormal findings: Secondary | ICD-10-CM

## 2016-09-12 LAB — CMP14+EGFR
A/G RATIO: 2 (ref 1.2–2.2)
ALK PHOS: 54 IU/L (ref 39–117)
ALT: 30 IU/L (ref 0–44)
AST: 25 IU/L (ref 0–40)
Albumin: 4.7 g/dL (ref 3.5–5.5)
BILIRUBIN TOTAL: 0.2 mg/dL (ref 0.0–1.2)
BUN/Creatinine Ratio: 26 — ABNORMAL HIGH (ref 9–20)
BUN: 21 mg/dL (ref 6–24)
CALCIUM: 9.6 mg/dL (ref 8.7–10.2)
CHLORIDE: 104 mmol/L (ref 96–106)
CO2: 23 mmol/L (ref 18–29)
Creatinine, Ser: 0.82 mg/dL (ref 0.76–1.27)
GFR calc Af Amer: 118 mL/min/{1.73_m2} (ref >59)
GFR, EST NON AFRICAN AMERICAN: 102 mL/min/{1.73_m2} (ref >59)
GLOBULIN, TOTAL: 2.3 g/dL (ref 1.5–4.5)
Glucose: 89 mg/dL (ref 65–99)
POTASSIUM: 4.6 mmol/L (ref 3.5–5.2)
SODIUM: 143 mmol/L (ref 134–144)
Total Protein: 7 g/dL (ref 6.0–8.5)

## 2016-09-13 NOTE — Progress Notes (Signed)
Lab only 

## 2016-11-09 ENCOUNTER — Ambulatory Visit (INDEPENDENT_AMBULATORY_CARE_PROVIDER_SITE_OTHER): Payer: BLUE CROSS/BLUE SHIELD

## 2016-11-09 ENCOUNTER — Encounter: Payer: Self-pay | Admitting: Family Medicine

## 2016-11-09 ENCOUNTER — Ambulatory Visit (INDEPENDENT_AMBULATORY_CARE_PROVIDER_SITE_OTHER): Payer: BLUE CROSS/BLUE SHIELD | Admitting: Family Medicine

## 2016-11-09 VITALS — BP 127/86 | HR 92 | Temp 98.2°F | Resp 18 | Ht 64.0 in | Wt 160.0 lb

## 2016-11-09 DIAGNOSIS — M545 Low back pain, unspecified: Secondary | ICD-10-CM

## 2016-11-09 DIAGNOSIS — M542 Cervicalgia: Secondary | ICD-10-CM | POA: Diagnosis not present

## 2016-11-09 DIAGNOSIS — S39012A Strain of muscle, fascia and tendon of lower back, initial encounter: Secondary | ICD-10-CM

## 2016-11-09 DIAGNOSIS — M549 Dorsalgia, unspecified: Secondary | ICD-10-CM

## 2016-11-09 DIAGNOSIS — G8929 Other chronic pain: Secondary | ICD-10-CM

## 2016-11-09 MED ORDER — MELOXICAM 7.5 MG PO TABS: 7.5000 mg | ORAL_TABLET | Freq: Every day | ORAL | 0 refills | Status: DC

## 2016-11-09 MED ORDER — CYCLOBENZAPRINE HCL 5 MG PO TABS: ORAL_TABLET | ORAL | 0 refills | Status: DC

## 2016-11-09 NOTE — Patient Instructions (Addendum)
You likely have a sprained ligament or strained muscle in the low back, which can lead to some muscle spasm as well. Try the mobic each morning (do not combine with other over the counter pain relievers), flexeril up to 3 times per day, but start at night if needed. Heat or ice to area as needed. I did provide a note for your work from yesterday through tomorrow, returning on Thursday. I did refer you to orthopedics to discuss physical therapy or other treatment for your back pain. If you do require FMLA paperwork, we can discuss details for that paperwork.   Return to the clinic or go to the nearest emergency room if any of your symptoms worsen or new symptoms occur.   Back Pain, Adult Back pain is very common in adults.The cause of back pain is rarely dangerous and the pain often gets better over time.The cause of your back pain may not be known. Some common causes of back pain include:  Strain of the muscles or ligaments supporting the spine.  Wear and tear (degeneration) of the spinal disks.  Arthritis.  Direct injury to the back.  For many people, back pain may return. Since back pain is rarely dangerous, most people can learn to manage this condition on their own. Follow these instructions at home: Watch your back pain for any changes. The following actions may help to lessen any discomfort you are feeling:  Remain active. It is stressful on your back to sit or stand in one place for long periods of time. Do not sit, drive, or stand in one place for more than 30 minutes at a time. Take short walks on even surfaces as soon as you are able.Try to increase the length of time you walk each day.  Exercise regularly as directed by your health care provider. Exercise helps your back heal faster. It also helps avoid future injury by keeping your muscles strong and flexible.  Do not stay in bed.Resting more than 1-2 days can delay your recovery.  Pay attention to your body when you bend  and lift. The most comfortable positions are those that put less stress on your recovering back. Always use proper lifting techniques, including: ? Bending your knees. ? Keeping the load close to your body. ? Avoiding twisting.  Find a comfortable position to sleep. Use a firm mattress and lie on your side with your knees slightly bent. If you lie on your back, put a pillow under your knees.  Avoid feeling anxious or stressed.Stress increases muscle tension and can worsen back pain.It is important to recognize when you are anxious or stressed and learn ways to manage it, such as with exercise.  Take medicines only as directed by your health care provider. Over-the-counter medicines to reduce pain and inflammation are often the most helpful.Your health care provider may prescribe muscle relaxant drugs.These medicines help dull your pain so you can more quickly return to your normal activities and healthy exercise.  Apply ice to the injured area: ? Put ice in a plastic bag. ? Place a towel between your skin and the bag. ? Leave the ice on for 20 minutes, 2-3 times a day for the first 2-3 days. After that, ice and heat may be alternated to reduce pain and spasms.  Maintain a healthy weight. Excess weight puts extra stress on your back and makes it difficult to maintain good posture.  Contact a health care provider if:  You have pain that is not relieved  with rest or medicine.  You have increasing pain going down into the legs or buttocks.  You have pain that does not improve in one week.  You have night pain.  You lose weight.  You have a fever or chills. Get help right away if:  You develop new bowel or bladder control problems.  You have unusual weakness or numbness in your arms or legs.  You develop nausea or vomiting.  You develop abdominal pain.  You feel faint. This information is not intended to replace advice given to you by your health care provider. Make sure you  discuss any questions you have with your health care provider. Document Released: 03/29/2005 Document Revised: 08/07/2015 Document Reviewed: 07/31/2013 Elsevier Interactive Patient Education  2017 [employer]  IF you received an x-ray today, you will receive an invoice from [facility]. Please contact [facility] at [phone] with questions or concerns regarding your invoice.   IF you received labwork today, you will receive an invoice from [facility]. Please contact [facility] at [phone] with questions or concerns regarding your invoice.   Our billing staff will not be able to assist you with questions regarding bills from these companies.  You will be contacted with the lab results as soon as they are available. The fastest way to get your results is to activate your My Chart account. Instructions are located on the last page of this paperwork. If you have not heard from us regarding the results in 2 weeks, please contact this office.

## 2016-11-09 NOTE — Progress Notes (Signed)
Subjective:  By signing my name below, I, James Cain, attest that this documentation has been prepared under the direction and in the presence of James StaggersJeffrey Shilee Biggs, MD. Electronically Signed: Stann Oresung-Kai Cain, Scribe. 11/09/2016 , 5:37 PM .  Patient was seen in Room 10 .   Patient ID: James Cain Cain, male    DOB: 04-22-1965, 51 y.o.   MRN: [id number] Chief Complaint  Patient presents with  . Back Pain    left side lower    HPI James Cain is a 51 y.o. male  Patient complains of left sided low back pain. He was most recently seen by Dr. Alwyn RenHopper in April for a physical exam. Per note, he had chronic problems with back pain; he was recommended to continue his regular exercise for back pain management. Prior, he was seen in Jan 2017 for back pain. Per review of Jan 2017 note, he had back pain ever since playing sports in high school and occasionally has to pop his back. He had previous treatments with chiropractor. He was treated with tylenol and aleve at that visit. Physical therapy referral was placed. He had thoracic and lumbar spine xray in Jan 2017, which showed areas of osteoarthritic change at multiple levels of T-spine, mild lumbar spondylosis and minimal anterior wedging of L3 vertebrae.   Patient reports his parents were into chiropracting and he learned to "self crack", where he would bend his back against flat surfaces, up to 10 times a day. 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   He complains turning a certain way 5 days ago and felt a pulling sensation in his left lower back. He was able to go to work after wearing a back brace. Over the weekend (2-3 days ago), he's been self treating the area with ice and heat. He was out of work yesterday and today. He denies pain radiating down his legs or to his groin. He denies bowel or urinary incontinence, or saddle anesthesia. He's been taking extra strength tylenol 1000mg   2-3x a day. He hasn't done any home exercises that Dr. Alwyn RenHopper printed out for him. He denies having problems with muscle relaxants in the past.   At the end of visit, he reports also having neck issues and neck pain that has been ongoing for a while He describes neck issues since he had left arm issues. He was given left arm home exercises and with it, his left shoulder became sore and overused. Since then, his neck has gotten really stiff and sore. Notices pain move up his back to his neck or vice versa when one area is sore.  There are no active problems to display for this patient.  Past Medical History:  Diagnosis Date  . Arthritis    Past Surgical History:  Procedure Laterality Date  . EYE SURGERY     No Known Allergies Prior to Admission medications   Medication Sig Start Date End Date Taking? Authorizing Provider  acetaminophen (TYLENOL) 500 MG tablet Take 1,000 mg by mouth 2 (two) times daily.    [provider]  loratadine (CLARITIN) 10 MG tablet Take 10 mg by mouth daily.    [provider]  Misc Natural Products (GLUCOSAMINE CHOND DOUBLE STR) TABS Take 1 tablet by mouth daily.    [provider]  Multiple Vitamins-Minerals (MULTIVITAMIN ADULT PO) Take 1 tablet by mouth daily.    [provider]  oxyCODONE-acetaminophen (PERCOCET/ROXICET) 5-325 MG  tablet Take 1-2 tablets by mouth every 4 (four) hours as needed for severe pain. Patient not taking: Reported on 07/24/2016 12/30/15   Cheri Fowler, PA-C  triamcinolone cream (KENALOG) 0.1 % Apply 1 application topically 2 (two) times daily. 07/24/16   Peyton Najjar, MD   Social History   Social History  . Marital status: Married    Spouse name: N/A  . Number of children: N/A  . Years of education: N/A   Occupational History  . Not on file.   Social History Main Topics  . Smoking status: Former Games developer  . Smokeless tobacco: Never Used  . Alcohol use No  . Drug use: No  . Sexual activity:  Not on file   Other Topics Concern  . Not on file   Social History Narrative  . No narrative on file   Review of Systems  Constitutional: Negative for fatigue and unexpected weight change.  Eyes: Negative for visual disturbance.  Respiratory: Negative for cough, chest tightness and shortness of breath.   Cardiovascular: Negative for chest pain, palpitations and leg swelling.  Gastrointestinal: Negative for abdominal pain and blood in stool.  Musculoskeletal: Positive for back pain, neck pain and neck stiffness.  Skin: Negative for rash and wound.  Neurological: Negative for dizziness, light-headedness and headaches.       Objective:   Physical Exam  Constitutional: He is oriented to person, place, and time. He appears well-developed and well-nourished. No distress.  HENT:  Head: Normocephalic and atraumatic.  Eyes: Pupils are equal, round, and reactive to light. EOM are normal.  Neck: Neck supple.  Cardiovascular: Normal rate.   Pulmonary/Chest: Effort normal. No respiratory distress.  Musculoskeletal: Normal range of motion.  Slight spasm over left paraspinals; lumbar spine flexion and extension intact, pain with left rotation, otherwise ROM intact. Negative straight leg raise, heel-toe gait normal Cervical spine: no midline bony tenderness, intact ROM, described pain over trapezius of the neck, no focal tenderness today  Neurological: He is alert and oriented to person, place, and time.  Reflex Scores:      Patellar reflexes are 2+ on the right side and 2+ on the left side.      Achilles reflexes are 2+ on the right side and 2+ on the left side. Skin: Skin is warm and dry.  Psychiatric: He has a normal mood and affect. His behavior is normal.  Nursing note and vitals reviewed.   Vitals:   11/09/16 1658  BP: 127/86  Pulse: 92  Resp: 18  Temp: 98.2 F (36.8 C)  TempSrc: Oral  SpO2: 95%  Weight: 160 lb (72.6 kg)  Height: 5\' 4"  (1.626 m)   Dg Lumbar Spine  Complete  Result Date: 11/09/2016 CLINICAL DATA:  Low back pain EXAM: LUMBAR SPINE - COMPLETE 4+ VIEW COMPARISON:  04/14/2015 FINDINGS: Five lumbar-type vertebral bodies. Normal lumbar lordosis. No evidence of fracture or dislocation. Vertebral body heights are maintained. Mild degenerative changes of the lower thoracic spine. Visualized bony pelvis appears intact. IMPRESSION: Negative. Electronically Signed   By: Charline Bills M.D.   On: 11/09/2016 17:54       Assessment & Plan:    Ajai Cain is a 51 y.o. male Chronic left-sided low back pain without sciatica - Plan: DG Lumbar Spine Complete, cyclobenzaprine (FLEXERIL) 5 MG tablet, AMB referral to orthopedics  Strain of lumbar region, initial encounter - Plan: meloxicam (MOBIC) 7.5 MG tablet, cyclobenzaprine (FLEXERIL) 5 MG tablet, AMB referral to orthopedics  Neck pain -  Plan: AMB referral to orthopedics  Upper back pain - Plan: AMB referral to orthopedics  Suspect long-standing low back pain, with reports of upper back pain, paraspinal neck/trapezius pain. Degenerative changes seen on previous imaging as well as lumbar spine imaging today without acute findings. Most recent pain likely strain/flare of low back pain.  - Trial of meloxicam 7.5 mg daily when necessary, Tylenol over-the-counter okay to use, Flexeril 5 mg 3 times a day when necessary spasm, side effects discussed, cautioned on driving or operating machinery on that medication.  - Note for work from yesterday through tomorrow, return Thursday. Recommended avoiding heavy lifting this week.   - Refer to orthopedics to decide on next step in treatment, anticipate physical therapy for neck and back, but can discuss options with orthopedics first.  - He did note he may need FMLA paperwork for future visits with orthopedist and physical therapy  - RTC precautions  Meds ordered this encounter  Medications  . meloxicam (MOBIC) 7.5 MG tablet    Sig: Take 1 tablet (7.5 mg  total) by mouth daily.    Dispense:  30 tablet    Refill:  0  . cyclobenzaprine (FLEXERIL) 5 MG tablet    Sig: 1 pill by mouth up to every 8 hours as needed. Start with one pill by mouth each bedtime as needed due to sedation    Dispense:  15 tablet    Refill:  0   Patient Instructions    You likely have a sprained ligament or strained muscle in the low back, which can lead to some muscle spasm as well. Try the mobic each morning (do not combine with other over the counter pain relievers), flexeril up to 3 times per day, but start at night if needed. Heat or ice to area as needed. I did provide a note for your work from yesterday through tomorrow, returning on Thursday. I did refer you to orthopedics to discuss physical therapy or other treatment for your back pain. If you do require FMLA paperwork, we can discuss details for that paperwork.   Return to the clinic or go to the nearest emergency room if any of your symptoms worsen or new symptoms occur.   Back Pain, Adult Back pain is very common in adults.The cause of back pain is rarely dangerous and the pain often gets better over time.The cause of your back pain may not be known. Some common causes of back pain include:  Strain of the muscles or ligaments supporting the spine.  Wear and tear (degeneration) of the spinal disks.  Arthritis.  Direct injury to the back.  For many people, back pain may return. Since back pain is rarely dangerous, most people can learn to manage this condition on their own. Follow these instructions at home: Watch your back pain for any changes. The following actions may help to lessen any discomfort you are feeling:  Remain active. It is stressful on your back to sit or stand in one place for long periods of time. Do not sit, drive, or stand in one place for more than 30 minutes at a time. Take short walks on even surfaces as soon as you are able.Try to increase the length of time you walk each  day.  Exercise regularly as directed by your health care provider. Exercise helps your back heal faster. It also helps avoid future injury by keeping your muscles strong and flexible.  Do not stay in bed.Resting more than 1-2 days can delay  your recovery.  Pay attention to your body when you bend and lift. The most comfortable positions are those that put less stress on your recovering back. Always use proper lifting techniques, including: ? Bending your knees. ? Keeping the load close to your body. ? Avoiding twisting.  Find a comfortable position to sleep. Use a firm mattress and lie on your side with your knees slightly bent. If you lie on your back, put a pillow under your knees.  Avoid feeling anxious or stressed.Stress increases muscle tension and can worsen back pain.It is important to recognize when you are anxious or stressed and learn ways to manage it, such as with exercise.  Take medicines only as directed by your health care provider. Over-the-counter medicines to reduce pain and inflammation are often the most helpful.Your health care provider may prescribe muscle relaxant drugs.These medicines help dull your pain so you can more quickly return to your normal activities and healthy exercise.  Apply ice to the injured area: ? Put ice in a plastic bag. ? Place a towel between your skin and the bag. ? Leave the ice on for 20 minutes, 2-3 times a day for the first 2-3 days. After that, ice and heat may be alternated to reduce pain and spasms.  Maintain a healthy weight. Excess weight puts extra stress on your back and makes it difficult to maintain good posture.  Contact a health care provider if:  You have pain that is not relieved with rest or medicine.  You have increasing pain going down into the legs or buttocks.  You have pain that does not improve in one week.  You have night pain.  You lose weight.  You have a fever or chills. Get help right away if:  You  develop new bowel or bladder control problems.  You have unusual weakness or numbness in your arms or legs.  You develop nausea or vomiting.  You develop abdominal pain.  You feel faint. This information is not intended to replace advice given to you by your health care provider. Make sure you discuss any questions you have with your health care provider. Document Released: 03/29/2005 Document Revised: 08/07/2015 Document Reviewed: 07/31/2013 Elsevier Interactive Patient Education  2017 [employer]  IF you received an x-ray today, you will receive an invoice from [facility]. Please contact [facility] at [phone] with questions or concerns regarding your invoice.   IF you received labwork today, you will receive an invoice from [facility]. Please contact [facility] at [phone] with questions or concerns regarding your invoice.   Our billing staff will not be able to assist you with questions regarding bills from these companies.  You will be contacted with the lab results as soon as they are available. The fastest way to get your results is to activate your My Chart account. Instructions are located on the last page of this paperwork. If you have not heard from us regarding the results in 2 weeks, please contact this office.       I personally performed the services described in this documentation, which was scribed in my presence. The recorded information has been reviewed and considered for accuracy and completeness, addended by me as needed, and agree with information above.  Signed,   James StaggersJeffrey Saben Donigan, MD [facility].  11/09/16 6:58 PM

## 2016-11-24 ENCOUNTER — Telehealth: Payer: Self-pay | Admitting: Family Medicine

## 2016-11-24 NOTE — Telephone Encounter (Signed)
Patient needs Dr Neva SeatGreene to complete FMLA forms for his for his lower back issues. I have completed what I could from the OV notes and highlighted the areas that need to be updated. I will place the forms in your box on 11/24/16 please return to the FMLA/Disability box at the 102 checkout desk within 5-7 business days. Thank you!

## 2016-12-01 NOTE — Telephone Encounter (Signed)
ppwk provided for 7/30-11/10/16.  No planned repeat OOW time noted, but had been referred to ortho. Placed ppwk in FMLA box.

## 2016-12-02 NOTE — Telephone Encounter (Signed)
Paperwork scanned and faxed on 12/02/16

## 2016-12-06 DIAGNOSIS — Z0271 Encounter for disability determination: Secondary | ICD-10-CM

## 2016-12-16 ENCOUNTER — Telehealth: Payer: Self-pay | Admitting: Family Medicine

## 2016-12-16 NOTE — Telephone Encounter (Signed)
PATIENT WOULD LIKE TO SPEAK WITH DR. Neva SeatGREENE REGARDING HIS FMLA PAPER WORK. HE SAID IT WAS NOT FILLED OUT CORRECTLY REGARDING HIS PHYSICAL THERAPY APPOINTMENTS AND HAVING TO LEAVE WORK. BEST PHONE [phone] (WORK PHONE DURING THE DAY) MBC

## 2016-12-20 NOTE — Telephone Encounter (Signed)
Please advise 

## 2016-12-21 NOTE — Telephone Encounter (Signed)
No problem. I'm happy to adjust that paperwork, but when I saw him on July 31, I referred him to orthopedics, and my understanding at that visit was that he needed a note for work from day prior to my visit to the following day only.   If he is now undergoing physical therapy treatment, that paperwork may need to be completed by his orthopedic surgeon.  We can also request notes from ortho, and I can try to complete paperwork based on their notes as well.

## 2016-12-22 NOTE — Telephone Encounter (Signed)
I will call patient today and see about his PT, I did not know he had called the message was not routed to me. So I will handle this. Thank you!

## 2016-12-27 NOTE — Telephone Encounter (Signed)
Called patient no answer, left message for him to call back.  If he calls back please ask him about his Phyiscal Therapy treatments. In the meantime I will ask ORTHO for his records.

## 2017-01-06 NOTE — Telephone Encounter (Signed)
I have not heard anything back from this patient about his PT or this paperwork so at this time I am closing this call and the paperwork can be returned to the FMLA/Disability box at the 102 checkout desk. Thank you

## 2017-02-04 ENCOUNTER — Ambulatory Visit (INDEPENDENT_AMBULATORY_CARE_PROVIDER_SITE_OTHER): Payer: BLUE CROSS/BLUE SHIELD

## 2017-02-04 ENCOUNTER — Encounter (INDEPENDENT_AMBULATORY_CARE_PROVIDER_SITE_OTHER): Payer: Self-pay | Admitting: Orthopaedic Surgery

## 2017-02-04 ENCOUNTER — Ambulatory Visit (INDEPENDENT_AMBULATORY_CARE_PROVIDER_SITE_OTHER): Payer: BLUE CROSS/BLUE SHIELD | Admitting: Orthopaedic Surgery

## 2017-02-04 VITALS — BP 120/74 | HR 88 | Ht 64.0 in | Wt 160.0 lb

## 2017-02-04 DIAGNOSIS — M542 Cervicalgia: Secondary | ICD-10-CM | POA: Diagnosis not present

## 2017-02-04 DIAGNOSIS — G8929 Other chronic pain: Secondary | ICD-10-CM

## 2017-02-04 DIAGNOSIS — M545 Low back pain, unspecified: Secondary | ICD-10-CM

## 2017-02-04 DIAGNOSIS — M47816 Spondylosis without myelopathy or radiculopathy, lumbar region: Secondary | ICD-10-CM

## 2017-02-04 DIAGNOSIS — M47812 Spondylosis without myelopathy or radiculopathy, cervical region: Secondary | ICD-10-CM

## 2017-02-04 NOTE — Progress Notes (Signed)
Office Visit Note   Patient: James Cain           Date of Birth: 1965/04/29           MRN: [id number] Visit Date: 02/04/2017              Requested by: Shade Flood, MD [address] [place], [place] [address] PCP: Patient, No Pcp Per   Assessment & Plan: Visit Diagnoses:  1. Neck pain   2. Spondylosis without myelopathy or radiculopathy, cervical region   3. Spondylosis without myelopathy or radiculopathy, lumbar region   4. Chronic bilateral low back pain without sciatica     Plan: Cervical spine and lumbar spine x-rays reviewed with patient today. Since he is not really complaining of too much pain at this time and his exam is fairly benign recommend conservative management with Mobic. He can continue to get this prescribed by his primary care physician. Follow-up with [place] in a few weeks for recheck. If symptoms worsen we may consider getting further imaging studies with MRI. We discussed proper lifting techniques. All questions answered.  Follow-Up Instructions: Return in about 4 weeks (around 03/04/2017).   Orders:  Orders Placed This Encounter  Procedures  . XR Cervical Spine 2 or 3 views   No orders of the defined types were placed in this encounter.     Procedures: No procedures performed   Clinical Data: No additional findings.   Subjective: Chief Complaint  Patient presents with  . Lower Back - Pain  . Neck - Pain    HPI Patient comes in with complaints of neck pain and low back pain. States that he's had off-and-on pain in both areas for several years. She is a currently neck is worse than the back. Neck pain extends into the scapular area. States that this is "tight" at times. Denies upper extremity radicular pain, numbness/tingling or weakness. Low back pain more central lumbar. Again no lower extremity radiculopathy. He did have lumbar spine x-ray performed 11/09/2016 and report read normal lumbar lordosis. No evidence of fracture or  dislocation. Vertebral body heights are maintained. Mild degenerative changes of the lower thoracic spine. States that his symptoms are not debilitating.  Taking Mobic  as needed prescribed by his primary care physician.  Review of Systems No current cardiac pulmonary GI GU issues.  Objective: Vital Signs: BP 120/74   Pulse 88   Ht 5\' 4"  (1.626 m)   Wt 160 lb (72.6 kg)   BMI 27.46 kg/m   Physical Exam  Constitutional: He appears well-developed. No distress.  HENT:  Head: Normocephalic.  Eyes: EOM are normal. Pupils are equal, round, and reactive to light.  Neck: Normal range of motion.  Mild left brachial plexus tenderness. Negative Spurling test.  Pulmonary/Chest: No respiratory distress.  Abdominal: He exhibits no distension.  Musculoskeletal:  Gait is normal. No lumbar paraspinal tenderness. Negative sciatic notch tenderness. Negative logroll bilateral hips. Negative straight leg raise. Neurovascularly intact throughout. No focal motor deficits throughout. Normal heel and toe gait  Neurological: He is alert.    Ortho Exam  Specialty Comments:  No specialty comments available.  Imaging: No results found.   PMFS History: There are no active problems to display for this patient.  Past Medical History:  Diagnosis Date  . Arthritis     Family History  Problem Relation Age of Onset  . Stroke Mother   . Heart disease Paternal Grandmother   . Hyperlipidemia Paternal Grandmother  Past Surgical History:  Procedure Laterality Date  . EYE SURGERY     Social History   Occupational History  . Not on file  Tobacco Use  . Smoking status: Former Games developermoker  . Smokeless tobacco: Never Used  Substance and Sexual Activity  . Alcohol use: No    Alcohol/week: 0.0 oz  . Drug use: No  . Sexual activity: Not on file    X-rays lumbar spine from 11/09/2016 reviewed by me today along with the report. IC disc space collapse at T11-T12 with vertebral spurs. T12-L1 narrowing.  Multilevel lumbar degenerative disc disease. 1-2 mm of L3 retrolisthesis on L4 and 1-2 mm of L4 anterolisthesis on L5. Disc space collapse L5-S1. L4-5 L5-S1 facet arthropathy.

## 2017-02-05 ENCOUNTER — Other Ambulatory Visit: Payer: Self-pay | Admitting: Family Medicine

## 2017-02-05 DIAGNOSIS — S39012A Strain of muscle, fascia and tendon of lower back, initial encounter: Secondary | ICD-10-CM

## 2017-02-07 NOTE — Telephone Encounter (Signed)
Please advise 

## 2017-02-12 NOTE — Telephone Encounter (Signed)
It appears he was seen recently by orthopedics. Did they prescribe him meloxicam or change treatment?

## 2017-02-14 NOTE — Telephone Encounter (Signed)
Left detailed message.   

## 2017-02-15 ENCOUNTER — Other Ambulatory Visit: Payer: Self-pay | Admitting: Family Medicine

## 2017-02-15 DIAGNOSIS — S39012A Strain of muscle, fascia and tendon of lower back, initial encounter: Secondary | ICD-10-CM

## 2017-02-22 ENCOUNTER — Telehealth: Payer: Self-pay | Admitting: General Practice

## 2017-02-22 NOTE — Telephone Encounter (Signed)
Copied from CRM [id number]. Topic: General - Other >> Feb 22, 2017  1:04 PM Windy KalataMichael, [place] Jenifer L, NT wrote: Reason for CRM: pt has called back stating he does not see the therapist until 11/27 and would like the meloxicam refilled before then if possible. Please give pt a call back.

## 2017-02-22 NOTE — Telephone Encounter (Signed)
Prescription called in 02-17-17

## 2017-02-23 ENCOUNTER — Telehealth: Payer: Self-pay | Admitting: Family Medicine

## 2017-02-23 NOTE — Telephone Encounter (Signed)
Pt called in reference to his refill on meloxicam; when he goes to pharmacy he is told that they do not have a prescription for him; pt also states that the MD's office told him that authorization was sent on 02/17/17; pt can be reached at work [phone]; contacted [facility] [address] and was told by the pharmacy technician that they do not have a recent prescription for this med; will [facility]

## 2017-02-24 ENCOUNTER — Encounter (INDEPENDENT_AMBULATORY_CARE_PROVIDER_SITE_OTHER): Payer: Self-pay | Admitting: Orthopaedic Surgery

## 2017-02-24 NOTE — Telephone Encounter (Signed)
Spoke with patient informed that prescription was called in to pharmacy.

## 2017-03-08 ENCOUNTER — Ambulatory Visit (INDEPENDENT_AMBULATORY_CARE_PROVIDER_SITE_OTHER): Payer: BLUE CROSS/BLUE SHIELD | Admitting: Orthopaedic Surgery

## 2017-03-08 DIAGNOSIS — M47812 Spondylosis without myelopathy or radiculopathy, cervical region: Secondary | ICD-10-CM

## 2017-03-08 DIAGNOSIS — M5136 Other intervertebral disc degeneration, lumbar region: Secondary | ICD-10-CM

## 2017-03-08 NOTE — Progress Notes (Signed)
Office Visit Note   Patient: James Cain           Date of Birth: 10-07-1965           MRN: [id number] Visit Date: 03/08/2017              Requested by: No referring provider defined for this encounter. PCP: Patient, No Pcp Per   Assessment & Plan: Visit Diagnoses:  1. Spondylosis without myelopathy or radiculopathy, cervical region   2. Other intervertebral disc degeneration, lumbar region     Plan: We reviewed the x-rays discussed pathophysiology looked at a some plastic models of the cervical spine and discussed pathophysiology of his condition..  I will check him back again in 6 months of either his back or his lumbar spine progressed to the point where he is having significant problems he can return and we can discussed imaging.  He understands that he may require surgical intervention if his symptoms progress.  He can use ibuprofen we discussed maximum dosage allowable and this is the 800 mg twice a day or he can use the Aleve 2 p.o. twice daily with food.  Recheck 6 months.  Follow-Up Instructions: Return in about 6 months (around 09/05/2017).   Orders:  No orders of the defined types were placed in this encounter.  No orders of the defined types were placed in this encounter.     Procedures: No procedures performed   Clinical Data: No additional findings.   Subjective: Chief Complaint  Patient presents with  . 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  He has been using anti-inflammatory medications.  He had some exercises that he was taught by therapy but really has not been doing them.  Sometimes pain wakes him up at night he sometimes has posterior cervical headaches associated with the neck pain.  He has discomfort with rotating his neck notes that it is pain radiates into his shoulders but not down to his  hands he has not noticed any weakness in his hands.  He is also had some pain at the thoracolumbar junction were previous x-rays noted some disc degeneration at the thoracolumbar junction with degenerative spurring.  Is also had some low back pain that radiates in his buttocks but not down to his feet.  No associated bowel or bladder symptoms.  He has a father has some disc degeneration at surgery.  Patient has bowel bladder symptoms associated.  Review of Systems 14 point review of systems updated unchanged from 02/01/2017 office visit other than as mentioned in HPI.   Objective: Vital Signs: BP 117/76   Pulse 74   Physical Exam  Constitutional: He is oriented to person, place, and time. He appears well-developed and well-nourished.  HENT:  Head: Normocephalic and atraumatic.  Eyes: EOM are normal. Pupils are equal, round, and reactive to light.  Neck: No tracheal deviation present. No thyromegaly present.  Cardiovascular: Normal rate.  Pulmonary/Chest: Effort normal. He has no wheezes.  Abdominal: Soft. Bowel sounds are normal.  Neurological: He is alert and oriented to person, place, and time.  Skin: Skin is warm and dry. Capillary refill takes less than 2 seconds.  Psychiatric: He has a normal mood and affect. His behavior is normal. Judgment and thought content normal.    Ortho Exam patient has some brachial plexus tenderness right  and left.  Upper extremity reflexes are 2+ no isolated motor weakness no shoulder impingement.  Minimal sciatic notch tenderness negative straight leg raising to 90 degrees he can heel and toe walk.  Sensory testing is normal normal normal pulses no atrophy good developed calfs.  No sensory deficit.  Specialty Comments:  No specialty comments available.  Imaging: Previous x-rays cervical spine showed significant mid cervical spondylitic changes with disc space narrowing.  Lumbar x-rays demonstrate L4-5 facet arthropathy thoracolumbar degenerative spurring  and grade 1 L4-5 anterolisthesis.   PMFS History: Patient Active Problem List   Diagnosis Date Noted  . Spondylosis without myelopathy or radiculopathy, cervical region 03/08/2017  . Other intervertebral disc degeneration, lumbar region 03/08/2017   Past Medical History:  Diagnosis Date  . Arthritis     Family History  Problem Relation Age of Onset  . Stroke Mother   . Heart disease Paternal Grandmother   . Hyperlipidemia Paternal Grandmother     Past Surgical History:  Procedure Laterality Date  . EYE SURGERY     Social History   Occupational History  . Not on file  Tobacco Use  . Smoking status: Former Games developermoker  . Smokeless tobacco: Never Used  Substance and Sexual Activity  . Alcohol use: No    Alcohol/week: 0.0 oz  . Drug use: No  . Sexual activity: Not on file

## 2017-03-09 ENCOUNTER — Encounter (INDEPENDENT_AMBULATORY_CARE_PROVIDER_SITE_OTHER): Payer: Self-pay | Admitting: Orthopaedic Surgery

## 2017-09-06 ENCOUNTER — Ambulatory Visit (INDEPENDENT_AMBULATORY_CARE_PROVIDER_SITE_OTHER): Payer: BLUE CROSS/BLUE SHIELD | Admitting: Orthopaedic Surgery

## 2017-09-06 ENCOUNTER — Encounter (INDEPENDENT_AMBULATORY_CARE_PROVIDER_SITE_OTHER): Payer: Self-pay | Admitting: Orthopaedic Surgery

## 2017-09-06 VITALS — BP 125/81 | HR 98 | Ht 64.0 in | Wt 165.0 lb

## 2017-09-06 DIAGNOSIS — M47812 Spondylosis without myelopathy or radiculopathy, cervical region: Secondary | ICD-10-CM | POA: Diagnosis not present

## 2017-09-06 DIAGNOSIS — M5136 Other intervertebral disc degeneration, lumbar region: Secondary | ICD-10-CM | POA: Diagnosis not present

## 2017-09-06 NOTE — Progress Notes (Signed)
Office Visit Note   James Cain: James Cain           Date of Birth: 16-Mar-1966           MRN: [id number] Visit Date: 09/06/2017              Requested by: No referring provider defined for this encounter. PCP: James Cain, No Pcp Per   Assessment & Plan: Visit Diagnoses:  1. Spondylosis without myelopathy or radiculopathy, cervical region   2. Other intervertebral disc degeneration, lumbar region     Plan: James Cain is taking some ibuprofen as well as Tylenol.  He is not on any narcotics.  He has cervical spondylosis as well as some degenerative L4-5 anterolisthesis.  Can you conservative treatment if he develops radicular symptoms in his upper extremities and he will need to proceed with cervical MRI scan.  If he gets progressive his symptoms he will call and let us know.  Follow-Up Instructions: Return if symptoms worsen or fail to improve.   Orders:  No orders of the defined types were placed in this encounter.  No orders of the defined types were placed in this encounter.     Procedures: No procedures performed   Clinical Data: No additional findings.   Subjective: Chief Complaint  James Cain presents with  . Neck - Follow-up    HPI 51 year old male returns for follow-up of cervical spondylosis at C5-6 and C6-7.  Continue to have tightness and states his symptoms are somewhat annoying.  He needs to work does a lot of lifting at work and really has not been able to do any workout activity once he gets home since he is tired and has other activities at the home.  James Cain has some pain that radiates into his scapula.  He has some discomfort with neck rotation.  No numbness or weakness in his arms.  James Cain has some L4-5 facet arthropathy with grade 1 anterolisthesis with intact pars.  Review of Systems view of systems unchanged from 02/01/2017 other than as mentioned above.   Objective: Vital Signs: BP 125/81   Pulse 98   Ht  (1.626 m)   Wt 165 lb (74.8 kg)    BMI 28.32 kg/m   Physical Exam  Constitutional: He is oriented to person, place, and time. He appears well-developed and well-nourished.  HENT:  Head: Normocephalic and atraumatic.  Eyes: Pupils are equal, round, and reactive to light. EOM are normal.  Neck: No tracheal deviation present. No thyromegaly present.  Cardiovascular: Normal rate.  Pulmonary/Chest: Effort normal. He has no wheezes.  Abdominal: Soft. Bowel sounds are normal.  Neurological: He is alert and oriented to person, place, and time.  Skin: Skin is warm and dry. Capillary refill takes less than 2 seconds.  Psychiatric: He has a normal mood and affect. His behavior is normal. Judgment and thought content normal.    Ortho Exam James Cain has some brachial plexus tenderness right and left.  Good upper extremity strength normal heel toe gait no lower extremity clonus.  Good calf strength.  No quad weakness good quad development.  Distal pulses are 2+.  Specialty Comments:  No specialty comments available.  Imaging: No results found.   PMFS History: James Cain Active Problem List   Diagnosis Date Noted  . Spondylosis without myelopathy or radiculopathy, cervical region 03/08/2017  . Other intervertebral disc degeneration, lumbar region 03/08/2017   Past Medical History:  Diagnosis Date  . Arthritis     Family History  Problem Relation  Age of Onset  . Stroke Mother   . Heart disease Paternal Grandmother   . Hyperlipidemia Paternal Grandmother     Past Surgical History:  Procedure Laterality Date  . EYE SURGERY     Social History   Occupational History  . Not on file  Tobacco Use  . Smoking status: Former Games developer  . Smokeless tobacco: Never Used  Substance and Sexual Activity  . Alcohol use: No    Alcohol/week: 0.0 oz  . Drug use: No  . Sexual activity: Not on file

## 2017-11-22 ENCOUNTER — Encounter: Payer: Self-pay | Admitting: Urgent Care

## 2017-11-22 ENCOUNTER — Ambulatory Visit (INDEPENDENT_AMBULATORY_CARE_PROVIDER_SITE_OTHER): Payer: BLUE CROSS/BLUE SHIELD | Admitting: Urgent Care

## 2017-11-22 VITALS — BP 131/82 | HR 91 | Temp 97.6°F | Resp 18 | Ht 64.0 in | Wt 162.2 lb

## 2017-11-22 DIAGNOSIS — J069 Acute upper respiratory infection, unspecified: Secondary | ICD-10-CM | POA: Diagnosis not present

## 2017-11-22 DIAGNOSIS — B9789 Other viral agents as the cause of diseases classified elsewhere: Secondary | ICD-10-CM | POA: Diagnosis not present

## 2017-11-22 DIAGNOSIS — R070 Pain in throat: Secondary | ICD-10-CM

## 2017-11-22 DIAGNOSIS — J342 Deviated nasal septum: Secondary | ICD-10-CM

## 2017-11-22 MED ORDER — BENZONATATE 100 MG PO CAPS: 100.0000 mg | ORAL_CAPSULE | Freq: Three times a day (TID) | ORAL | 0 refills | Status: DC | PRN

## 2017-11-22 MED ORDER — PSEUDOEPHEDRINE HCL ER 120 MG PO TB12: 120.0000 mg | ORAL_TABLET | Freq: Two times a day (BID) | ORAL | 3 refills | Status: DC

## 2017-11-22 MED ORDER — HYDROCODONE-HOMATROPINE 5-1.5 MG/5ML PO SYRP: 5.0000 mL | ORAL_SOLUTION | Freq: Every evening | ORAL | 0 refills | Status: DC | PRN

## 2017-11-22 NOTE — Progress Notes (Signed)
    MRN: [id number] DOB: [birth year]  Subjective:   James Cain is a 52 y.o. male presenting for 1.5 day history of sore throat, dry cough, wheezing, sinus congestion. Has used cough drops and Thera flu. Denies smoking cigarettes. 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   James Cain has a current medication list which includes the following prescription(s): acetaminophen, loratadine, glucosamine chond double str, multiple vitamins-minerals, and triamcinolone cream. Also has No Known Allergies.  James Cain  has a past medical history of Arthritis. Also  has a past surgical history that includes Eye surgery.  Objective:   Vitals: BP 131/82   Pulse 91   Temp 97.6 F (36.4 C) (Oral)   Resp 18   Ht 5\' 4"  (1.626 m)   Wt 162 lb 3.2 oz (73.6 kg)   SpO2 97%   BMI 27.84 kg/m   Physical Exam  Constitutional: He is oriented to person, place, and time. He appears well-developed and well-nourished.  HENT:  Right Ear: Tympanic membrane normal.  Left Ear: Tympanic membrane normal.  Nose: Mucosal edema present. No sinus tenderness.  Mouth/Throat: Oropharynx is clear and moist.  Eyes: Right eye exhibits no discharge. Left eye exhibits no discharge. No scleral icterus.  Neck: Normal range of motion. Neck supple.  Cardiovascular: Normal rate, regular rhythm, normal heart sounds and intact distal pulses. Exam reveals no gallop and no friction rub.  No murmur heard. Pulmonary/Chest: Effort normal and breath sounds normal. No respiratory distress. He has no wheezes. He has no rales.  Lymphadenopathy:    He has no cervical adenopathy.  Neurological: He is alert and oriented to person, place, and time.  Skin: Skin is warm and dry.  Psychiatric: He has a normal mood and affect.    Assessment and Plan :   Viral URI with cough  Throat pain  Deviated septum  Likely viral in etiology d/t reassuring physical exam findings. Advised supportive care, offered symptomatic relief. Return-to-clinic  precautions discussed, patient verbalized understanding.    Wallis BambergMario Tija Biss, PA-C Primary Care at [facility] [phone] 11/22/2017  4:08 PM

## 2017-11-22 NOTE — Patient Instructions (Addendum)
I will contact you with your lab results within the next 2 weeks.  If you have not heard from us then please contact us. The fastest way to get your results is to register for My Chart.    Viral Respiratory Infection A respiratory infection is an illness that affects part of the respiratory system, such as the lungs, nose, or throat. Most respiratory infections are caused by either viruses or bacteria. A respiratory infection that is caused by a virus is called a viral respiratory infection. Common types of viral respiratory infections include:  A cold.  The flu (influenza).  A respiratory syncytial virus (RSV) infection.  How do I know if I have a viral respiratory infection? Most viral respiratory infections cause:  A stuffy or runny nose.  Yellow or green nasal discharge.  A cough.  Sneezing.  Fatigue.  Achy muscles.  A sore throat.  Sweating or chills.  A fever.  A headache.  How are viral respiratory infections treated? If influenza is diagnosed early, it may be treated with an antiviral medicine that shortens the length of time a person has symptoms. Symptoms of viral respiratory infections may be treated with over-the-counter and prescription medicines, such as:  Expectorants. These make it easier to cough up mucus.  Decongestant nasal sprays.  Health care providers do not prescribe antibiotic medicines for viral infections. This is because antibiotics are designed to kill bacteria. They have no effect on viruses. How do I know if I should stay home from work or school? To avoid exposing others to your respiratory infection, stay home if you have:  A fever.  A persistent cough.  A sore throat.  A runny nose.  Sneezing.  Muscles aches.  Headaches.  Fatigue.  Weakness.  Chills.  Sweating.  Nausea.  Follow these instructions at home:  Rest as much as possible.  Take over-the-counter and prescription medicines only as told by your  health care provider.  Drink enough fluid to keep your urine clear or pale yellow. This helps prevent dehydration and helps loosen up mucus.  Gargle with a salt-water mixture 3-4 times per day or as needed. To make a salt-water mixture, completely dissolve -1 tsp of salt in 1 cup of warm water.  Use nose drops made from salt water to ease congestion and soften raw skin around your nose.  Do not drink alcohol.  Do not use tobacco products, including cigarettes, chewing tobacco, and e-cigarettes. If you need help quitting, ask your health care provider. Contact a health care provider if:  Your symptoms last for 10 days or longer.  Your symptoms get worse over time.  You have a fever.  You have severe sinus pain in your face or forehead.  The glands in your jaw or neck become very swollen. Get help right away if:  You feel pain or pressure in your chest.  You have shortness of breath.  You faint or feel like you will faint.  You have severe and persistent vomiting.  You feel confused or disoriented. This information is not intended to replace advice given to you by your health care provider. Make sure you discuss any questions you have with your health care provider. Document Released: 01/06/2005 Document Revised: 09/04/2015 Document Reviewed: 09/04/2014 Elsevier Interactive Patient Education  2018 [employer]    IF you received an x-ray today, you will receive an invoice from [facility]. Please contact [facility] at [phone] with questions or concerns regarding your invoice.   IF  you received labwork today, you will receive an invoice from [facility]. Please contact [facility] at [phone] with questions or concerns regarding your invoice.   Our billing staff will not be able to assist you with questions regarding bills from these companies.  You will be contacted with the lab results as soon as they are available. The fastest way to get your  results is to activate your My Chart account. Instructions are located on the last page of this paperwork. If you have not heard from us regarding the results in 2 weeks, please contact this office.

## 2018-11-12 IMAGING — DX DG LUMBAR SPINE COMPLETE 4+V
5 series · 5 of 5 positions shown · non-contrast
Comparison: 04/14/2015

CLINICAL DATA: Low back pain

EXAM:
LUMBAR SPINE - COMPLETE 4+ VIEW

[l-spine ap]
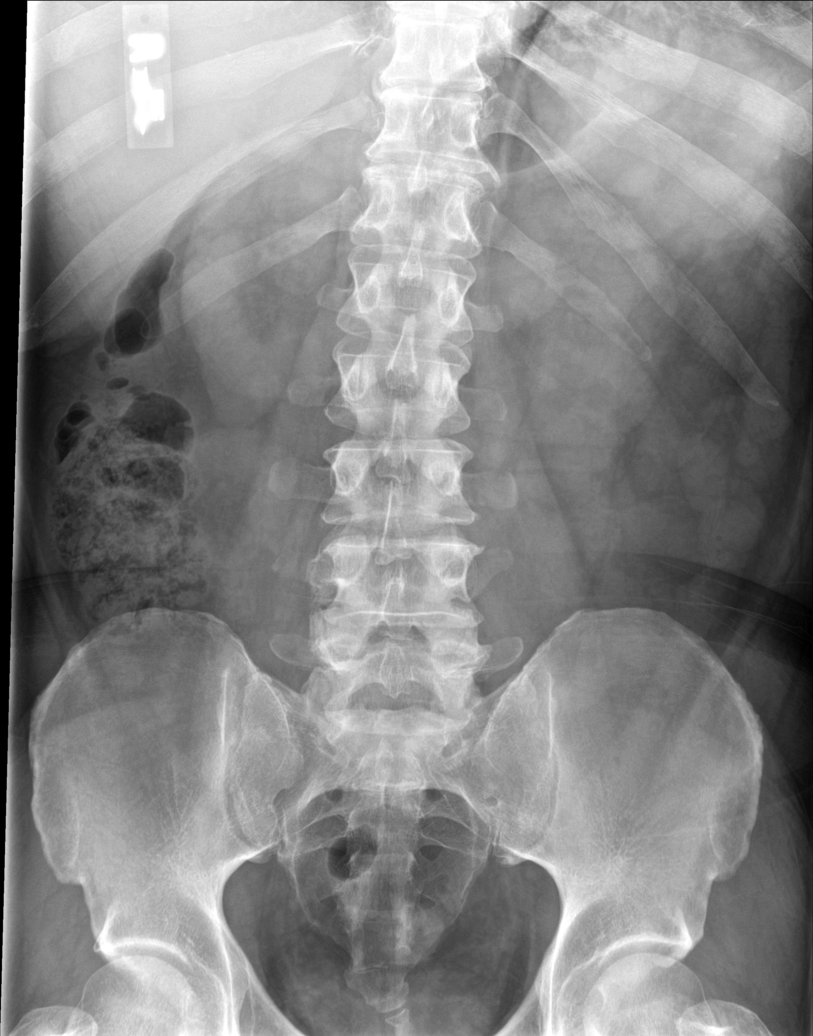

[l-spine obl (1 of 2)]
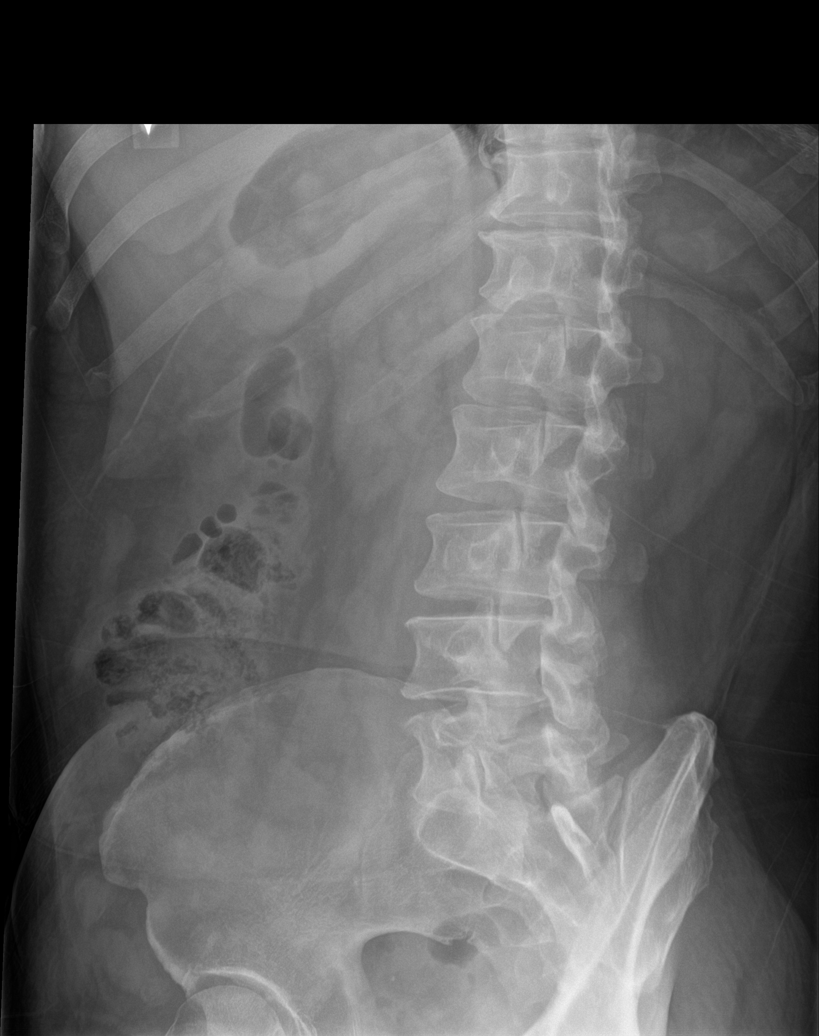

[l-spine obl (2 of 2)]
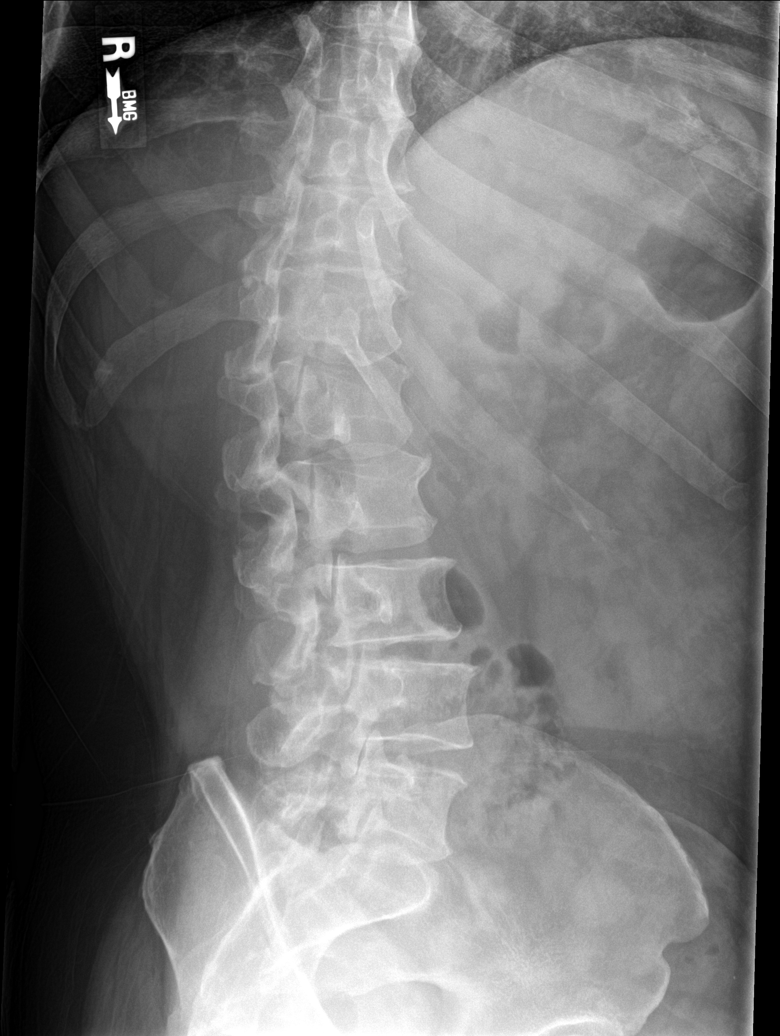

[l-spine lat]
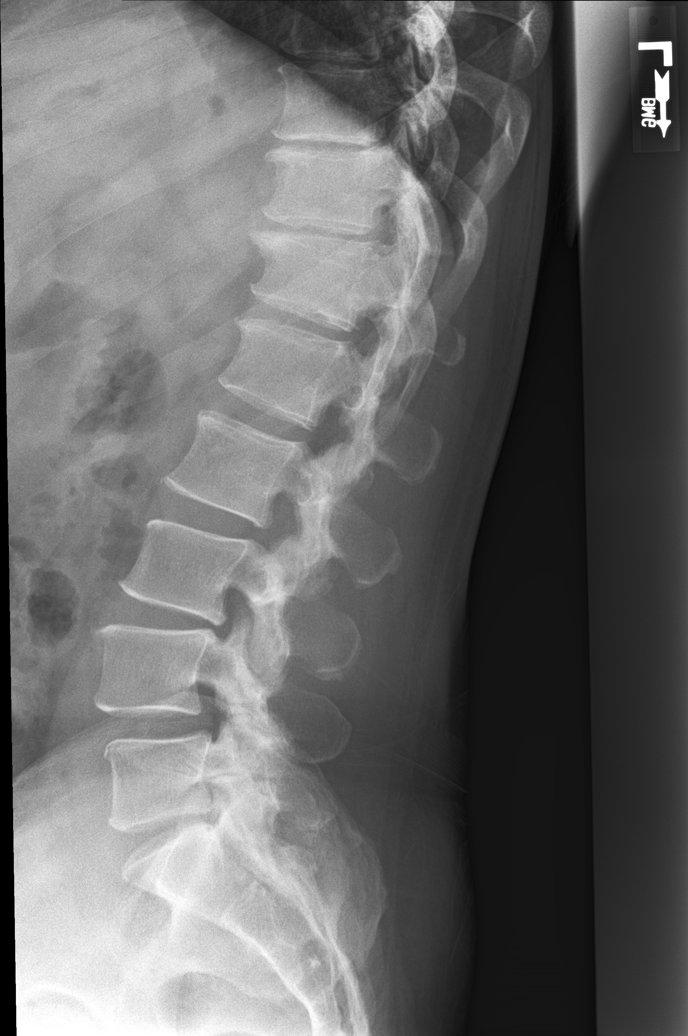

[l-spine l5-s1]
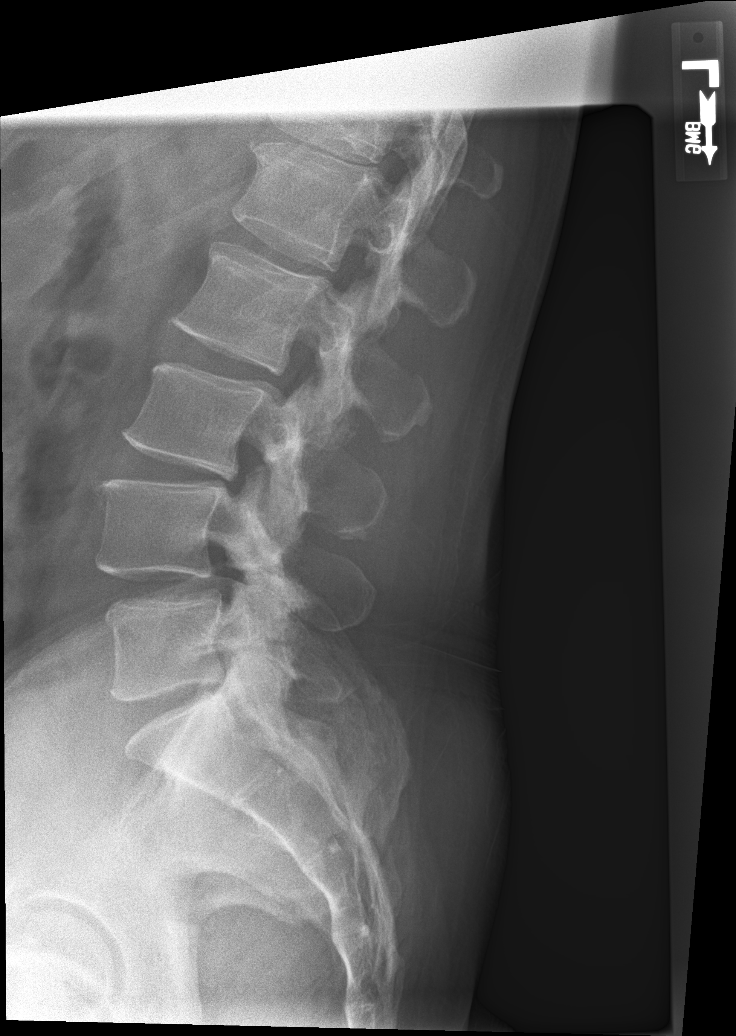

[5 of 5 positions shown; findings below may reference images not displayed]

FINDINGS: Five lumbar-type vertebral bodies.

Normal lumbar lordosis.

No evidence of fracture or dislocation. Vertebral body heights are
maintained.

Mild degenerative changes of the lower thoracic spine.

Visualized bony pelvis appears intact.
IMPRESSION: Negative.

## 2019-10-16 ENCOUNTER — Ambulatory Visit (HOSPITAL_COMMUNITY)
Admission: RE | Admit: 2019-10-16 | Discharge: 2019-10-16 | Disposition: A | Discharge status: Home / Self Care | Payer: HRSA Program | Source: Ambulatory Visit | Attending: Family Medicine | Admitting: Family Medicine

## 2019-10-16 ENCOUNTER — Other Ambulatory Visit: Payer: Self-pay

## 2019-10-16 ENCOUNTER — Encounter (HOSPITAL_COMMUNITY): Payer: Self-pay

## 2019-10-16 VITALS — BP 146/97 | HR 98 | Temp 99.4°F | Resp 20

## 2019-10-16 DIAGNOSIS — Z87891 Personal history of nicotine dependence: Secondary | ICD-10-CM | POA: Insufficient documentation

## 2019-10-16 DIAGNOSIS — R6883 Chills (without fever): Secondary | ICD-10-CM | POA: Insufficient documentation

## 2019-10-16 DIAGNOSIS — Z20822 Contact with and (suspected) exposure to covid-19: Secondary | ICD-10-CM | POA: Diagnosis not present

## 2019-10-16 DIAGNOSIS — R197 Diarrhea, unspecified: Secondary | ICD-10-CM | POA: Diagnosis present

## 2019-10-16 DIAGNOSIS — M79601 Pain in right arm: Secondary | ICD-10-CM | POA: Diagnosis not present

## 2019-10-16 DIAGNOSIS — Z79899 Other long term (current) drug therapy: Secondary | ICD-10-CM | POA: Diagnosis not present

## 2019-10-16 LAB — SARS CORONAVIRUS 2 (TAT 6-24 HRS): SARS Coronavirus 2: NEGATIVE

## 2019-10-16 NOTE — Discharge Instructions (Signed)
We have tested you for covid Results should be in your my chart tomorrow  Believe the arm pain was a pulled muscle Stretch, strengthen exercises Follow up as needed for continued or worsening symptoms

## 2019-10-16 NOTE — ED Triage Notes (Signed)
Pt c/o body aches, diarrhea onset Thursday, improved with Immodium AD on Sunday.  Here for COVID test to try to return to work tomorrow.  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  Last tylenol taken at approx 1030 this morning.  Denies congestion, runny nose, sore throat, chills, cough, SOB.

## 2019-10-17 NOTE — ED Provider Notes (Signed)
[facility]    CSN: [id number] Arrival date & time: 10/16/19  1201      History   Chief Complaint Chief Complaint  Patient presents with  . Diarrhea    HPI James Cain. is a 54 y.o. male.   Patient is a 54 year old male the presents today with diarrhea.  This was onset last Thursday.  He also has some associated body aches and chills with this.  Throughout the weekend took Imodium A-D and Pepto and since his symptoms have resolved.  He is currently not having any diarrhea.  Requesting Covid test to return to work tomorrow.  No congestion, rhinorrhea, sore throat, chills, cough or shortness of breath.   Patient also having right arm pain.  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  States he was using the levers and his right arm jerked backwards.  Since this is happened he has had improvement of symptoms.  Just felt like muscle tightness in the past up and tricep area.  Has been taking Tylenol which helps.  Has also been doing strengthening and stretching exercises to help.  ROS per HPI      Past Medical History:  Diagnosis Date  . Arthritis     Patient Active Problem List   Diagnosis Date Noted  . Spondylosis without myelopathy or radiculopathy, cervical region 03/08/2017  . Other intervertebral disc degeneration, lumbar region 03/08/2017    Past Surgical History:  Procedure Laterality Date  . EYE SURGERY         Home Medications    Prior to Admission medications   Medication Sig Start Date End Date Taking? Authorizing Provider  acetaminophen (TYLENOL) 500 MG tablet Take 1,000 mg by mouth 2 (two) times daily.   Yes [provider]  Misc Natural Products (GLUCOSAMINE CHOND DOUBLE STR) TABS Take 1 tablet by mouth daily.   Yes [provider]  benzonatate (TESSALON) 100 MG capsule Take 1-2 capsules (100-200 mg total) by mouth 3 (three) times daily as needed. 11/22/17   Wallis Bamberg, PA-C    HYDROcodone-homatropine [facility]) 5-1.5 MG/5ML syrup Take 5 mLs by mouth at bedtime as needed. 11/22/17   Wallis Bamberg, PA-C  loratadine (CLARITIN) 10 MG tablet Take 10 mg by mouth daily.    [provider]  Multiple Vitamins-Minerals (MULTIVITAMIN ADULT PO) Take 1 tablet by mouth daily.    [provider]  pseudoephedrine (SUDAFED 12 HOUR) 120 MG 12 hr tablet Take 1 tablet (120 mg total) by mouth 2 (two) times daily. 11/22/17   Wallis Bamberg, PA-C  triamcinolone cream (KENALOG) 0.1 % Apply 1 application topically 2 (two) times daily. 07/24/16   Peyton Najjar, MD    Family History Family History  Problem Relation Age of Onset  . Stroke Mother   . Heart disease Paternal Grandmother   . Hyperlipidemia Paternal Grandmother     Social History Social History   Tobacco Use  . Smoking status: Former Games developer  . Smokeless tobacco: Never Used  Substance Use Topics  . Alcohol use: No    Alcohol/week: 0.0 standard drinks  . Drug use: No     Allergies   Patient has no known allergies.   Review of Systems Review of Systems   Physical Exam Triage Vital Signs ED Triage Vitals [10/16/19 1225]  Enc Vitals Group     BP (!) 146/97     Pulse Rate 98     Resp 20     Temp 99.4  F (37.4 C)     Temp Source Oral     SpO2 99 %     Weight      Height      Head Circumference      Peak Flow      Pain Score 0     Pain Loc      Pain Edu?      Excl. in GC?    No data found.  Updated Vital Signs BP (!) 146/97 (BP Location: Right Arm)   Pulse 98   Temp 99.4 F (37.4 C) (Oral)   Resp 20   SpO2 99%   Visual Acuity Right Eye Distance:   Left Eye Distance:   Bilateral Distance:    Right Eye Near:   Left Eye Near:    Bilateral Near:     Physical Exam Vitals and nursing note reviewed.  Constitutional:      Appearance: Normal appearance.  HENT:     Head: Normocephalic and atraumatic.     Nose: Nose normal.  Eyes:     Conjunctiva/sclera: Conjunctivae normal.   Pulmonary:     Effort: Pulmonary effort is normal.  Musculoskeletal:        General: Normal range of motion.     Cervical back: Normal range of motion.     Comments: No current arm pain.  Good range of motion with the right arm.  No swelling, bruising. No bulging or indentions  Skin:    General: Skin is warm and dry.  Neurological:     Mental Status: He is alert.  Psychiatric:        Mood and Affect: Mood normal.      UC Treatments / Results  Labs (all labs ordered are listed, but only abnormal results are displayed) Labs Reviewed  SARS CORONAVIRUS 2 (TAT 6-24 HRS)    EKG   Radiology No results found.  Procedures Procedures (including critical care time)  Medications Ordered in UC Medications - No data to display  Initial Impression / Assessment and Plan / UC Course  I have reviewed the triage vital signs and the nursing notes.  Pertinent labs & imaging results that were available during my care of the patient were reviewed by me and considered in my medical decision making (see chart for details).     Diarrhea Most likely something viral.  Doubt Covid.  Symptoms have resolved.  Believe it is okay for him to go back to work. Covid swab pending he can go back to work on Thursday if swab is negative.  Arm pain Most likely pulled muscle.  Symptoms have improved. Nothing concerning on exam. Follow up as needed for continued or worsening symptoms  Final Clinical Impressions(s) / UC Diagnoses   Final diagnoses:  Diarrhea, unspecified type  Arm pain, anterior, right     Discharge Instructions     We have tested you for covid Results should be in your my chart tomorrow  Believe the arm pain was a pulled muscle Stretch, strengthen exercises Follow up as needed for continued or worsening symptoms     ED Prescriptions    None     PDMP not reviewed this encounter.   Janace Aris, NP 10/17/19 1251

## 2020-02-15 ENCOUNTER — Ambulatory Visit (INDEPENDENT_AMBULATORY_CARE_PROVIDER_SITE_OTHER): Payer: Managed Care, Other (non HMO) | Admitting: Family Medicine

## 2020-02-15 ENCOUNTER — Other Ambulatory Visit: Payer: Self-pay

## 2020-02-15 ENCOUNTER — Encounter: Payer: Self-pay | Admitting: Family Medicine

## 2020-02-15 VITALS — BP 128/77 | HR 60 | Temp 98.0°F | Ht 63.5 in | Wt 157.0 lb

## 2020-02-15 DIAGNOSIS — G8929 Other chronic pain: Secondary | ICD-10-CM

## 2020-02-15 DIAGNOSIS — J309 Allergic rhinitis, unspecified: Secondary | ICD-10-CM | POA: Diagnosis not present

## 2020-02-15 DIAGNOSIS — Z1211 Encounter for screening for malignant neoplasm of colon: Secondary | ICD-10-CM

## 2020-02-15 DIAGNOSIS — M545 Other chronic pain: Secondary | ICD-10-CM

## 2020-02-15 DIAGNOSIS — Z23 Encounter for immunization: Secondary | ICD-10-CM | POA: Diagnosis not present

## 2020-02-15 DIAGNOSIS — E781 Pure hyperglyceridemia: Secondary | ICD-10-CM | POA: Diagnosis not present

## 2020-02-15 NOTE — Patient Instructions (Addendum)
Loratadine up to daily if needed for allergies. Option of adding flonase if needed.  Follow up if those are not effective for your symptoms.   1st shingles vaccine today - 2nd one due in 2-6 months.   Occasional tylenol or advil if needed for occasional back pain, but I would like to meet in next few months to discuss back further. Fasting labs at next visit.   Thanks for coming in today.  Return to the clinic or go to the nearest emergency room if any of your symptoms worsen or new symptoms occur.   Chronic Back Pain When back pain lasts longer than 3 months, it is called chronic back pain.The cause of your back pain may not be known. Some common causes include:  Wear and tear (degenerative disease) of the bones, ligaments, or disks in your back.  Inflammation and stiffness in your back (arthritis). People who have chronic back pain often go through certain periods in which the pain is more intense (flare-ups). Many people can learn to manage the pain with home care. Follow these instructions at home: Pay attention to any changes in your symptoms. Take these actions to help with your pain: Activity   Avoid bending and other activities that make the problem worse.  Maintain a proper position when standing or sitting: ? When standing, keep your upper back and neck straight, with your shoulders pulled back. Avoid slouching. ? When sitting, keep your back straight and relax your shoulders. Do not round your shoulders or pull them backward.  Do not sit or stand in one place for long periods of time.  Take brief periods of rest throughout the day. This will reduce your pain. Resting in a lying or standing position is usually better than sitting to rest.  When you are resting for longer periods, mix in some mild activity or stretching between periods of rest. This will help to prevent stiffness and pain.  Get regular exercise. Ask your health care provider what activities are safe for  you.  Do not lift anything that is heavier than 10 lb (4.5 kg). Always use proper lifting technique, which includes: ? Bending your knees. ? Keeping the load close to your body. ? Avoiding twisting.  Sleep on a firm mattress in a comfortable position. Try lying on your side with your knees slightly bent. If you lie on your back, put a pillow under your knees. Managing pain  If directed, apply ice to the painful area. Your health care provider may recommend applying ice during the first 24-48 hours after a flare-up begins. ? Put ice in a plastic bag. ? Place a towel between your skin and the bag. ? Leave the ice on for 20 minutes, 2-3 times per day.  If directed, apply heat to the affected area as often as told by your health care provider. Use the heat source that your health care provider recommends, such as a moist heat pack or a heating pad. ? Place a towel between your skin and the heat source. ? Leave the heat on for 20-30 minutes. ? Remove the heat if your skin turns bright red. This is especially important if you are unable to feel pain, heat, or cold. You may have a greater risk of getting burned.  Try soaking in a warm tub.  Take over-the-counter and prescription medicines only as told by your health care provider.  Keep all follow-up visits as told by your health care provider. This is important. Contact a  health care provider if:  You have pain that is not relieved with rest or medicine. Get help right away if:  You have weakness or numbness in one or both of your legs or feet.  You have trouble controlling your bladder or your bowels.  You have nausea or vomiting.  You have pain in your abdomen.  You have shortness of breath or you faint. This information is not intended to replace advice given to you by your health care provider. Make sure you discuss any questions you have with your health care provider. Document Revised: 07/20/2018 Document Reviewed:  10/06/2016 Elsevier Patient Education  [employer].   If you have lab work done today you will be contacted with your lab results within the next 2 weeks.  If you have not heard from [place] then please contact us. The fastest way to get your results is to register for My Chart.   IF you received an x-ray today, you will receive an invoice from [facility]. Please contact [facility] at [phone] with questions or concerns regarding your invoice.   IF you received labwork today, you will receive an invoice from [facility]. Please contact [facility] at [phone] with questions or concerns regarding your invoice.   Our billing staff will not be able to assist you with questions regarding bills from these companies.  You will be contacted with the lab results as soon as they are available. The fastest way to get your results is to activate your My Chart account. Instructions are located on the last page of this paperwork. If you have not heard from [place] regarding the results in 2 weeks, please contact this office.

## 2020-02-15 NOTE — Progress Notes (Signed)
Subjective:  Patient ID: James Cain., male    DOB: June 08, 1965  Age: 54 y.o. MRN: [id number]  CC:  Chief Complaint  Patient presents with  . Transitions Of Care    ask about vaccines     HPI James Cain. presents for   Reestablish care.  Most recently seen in 2019 by Wallis Bamberg.  I saw him for his low back pain, neck pain in 2018.  Referred to orthopedics, saw Dr. Ophelia Charter.  Cervical spondylosis, lumbar degenerative disc disease. Occasional twinge of back pain, but improves on own with natural supplements. Rare ibuprofen, tyelnol.  Still able to do normal activities and work. Bending, sweeping.   No current prescription medications, has taken loratadine for allergies previously. Uses primarily in spring, but works at [employer]. Loratadine as needed.   Grandparents with heart disease - no first degree relatives with early CAD.    Immunization History  Administered Date(s) Administered  . PFIZER SARS-COV-2 Vaccination 01/03/2020, 01/25/2020  . Tdap 12/20/2011  updated tetanus few months ago with R hand injury.  Flu vaccine: declines at this time.  Covid vaccine: 9/23 and 01/25/20.  Shingles vaccine: agrees to shingrix.   Health maintenance: Colon cancer screening - has not had, no FH colon canceer, polyps or bleeding Screening options with colonoscopy versus Cologuard discussed. Discussed timing of repeat testing intervals if normal, as well as potential need for diagnostic Colonoscopy if positive Cologuard. Understanding expressed, and chose Cologuard.   HIV screening, hep C screen:  Hyperlipidemia: Most recent testing 2018 with mild hypertriglyceridemia, elevated LDL. Last ate 1 hr ago.  Lab Results  Component Value Date   CHOL 197 07/24/2016   HDL 41 07/24/2016   LDLCALC 117 (H) 07/24/2016   TRIG 195 (H) 07/24/2016   CHOLHDL 4.8 07/24/2016   Lab Results  Component Value Date   ALT 30 09/11/2016   AST 25 09/11/2016   ALKPHOS 54  09/11/2016   BILITOT 0.2 09/11/2016    History Patient Active Problem List   Diagnosis Date Noted  . Spondylosis without myelopathy or radiculopathy, cervical region 03/08/2017  . Other intervertebral disc degeneration, lumbar region 03/08/2017   Past Medical History:  Diagnosis Date  . Arthritis    Past Surgical History:  Procedure Laterality Date  . EYE SURGERY     No Known Allergies Prior to Admission medications   Medication Sig Start Date End Date Taking? Authorizing Provider  acetaminophen (TYLENOL) 500 MG tablet Take 1,000 mg by mouth 2 (two) times daily.   Yes [provider]  loratadine (CLARITIN) 10 MG tablet Take 10 mg by mouth daily.   Yes [provider]  Misc Natural Products (GLUCOSAMINE CHOND DOUBLE STR) TABS Take 1 tablet by mouth daily.   Yes [provider]  Multiple Vitamins-Minerals (MULTIVITAMIN ADULT PO) Take 1 tablet by mouth daily.   Yes [provider]  UNABLE TO FIND Curamin for pain   Yes [provider]  UNABLE TO FIND Med Name:heal and sooth   Yes [provider]   Social History   Socioeconomic History  . Marital status: Married    Spouse name: Not on file  . Number of children: Not on file  . Years of education: Not on file  . Highest education level: Not on file  Occupational History  . Not on file  Tobacco Use  . Smoking status: Former Games developer  . Smokeless tobacco: Never Used  Substance and Sexual Activity  .  Alcohol use: No    Alcohol/week: 0.0 standard drinks  . Drug use: No  . Sexual activity: Not on file  Other Topics Concern  . Not on file  Social History Narrative  . Not on file   Social Determinants of Health   Financial Resource Strain:   . Difficulty of Paying Living Expenses: Not on file  Food Insecurity:   . Worried About Programme researcher, broadcasting/film/video in the Last Year: Not on file  . Ran Out of Food in the Last Year: Not on file  Transportation Needs:   . Lack of  Transportation (Medical): Not on file  . Lack of Transportation (Non-Medical): Not on file  Physical Activity:   . Days of Exercise per Week: Not on file  . Minutes of Exercise per Session: Not on file  Stress:   . Feeling of Stress : Not on file  Social Connections:   . Frequency of Communication with Friends and Family: Not on file  . Frequency of Social Gatherings with Friends and Family: Not on file  . Attends Religious Services: Not on file  . Active Member of Clubs or Organizations: Not on file  . Attends Banker Meetings: Not on file  . Marital Status: Not on file  Intimate Partner Violence:   . Fear of Current or Ex-Partner: Not on file  . Emotionally Abused: Not on file  . Physically Abused: Not on file  . Sexually Abused: Not on file    Review of Systems   Objective:   Vitals:   02/15/20 1357  BP: 128/77  Pulse: 60  Temp: 98 F (36.7 C)  TempSrc: Temporal  SpO2: 97%  Weight: 157 lb (71.2 kg)  Height: 5' 3.5" (1.613 m)     Physical Exam Vitals reviewed.  Constitutional:      Appearance: He is well-developed.  HENT:     Head: Normocephalic and atraumatic.  Eyes:     Pupils: Pupils are equal, round, and reactive to light.  Neck:     Vascular: No carotid bruit or JVD.  Cardiovascular:     Rate and Rhythm: Normal rate and regular rhythm.     Heart sounds: Normal heart sounds. No murmur heard.   Pulmonary:     Effort: Pulmonary effort is normal.     Breath sounds: Normal breath sounds. No rales.  Musculoskeletal:     Comments: Pain free ROM lumbar spine no bony ttp - able to heel and toe walk without difficulty.   Skin:    General: Skin is warm and dry.  Neurological:     Mental Status: He is alert and oriented to person, place, and time.     34 minutes spent during visit, greater than 50% counseling and assimilation of information, chart review, and discussion of plan.  Assessment & Plan:  Efosa Treichler. is a 54 y.o. male  . Allergic rhinitis, unspecified seasonality, unspecified trigger  - stable with otc meds.   Need for shingles vaccine - Plan: Varicella-zoster vaccine subcutaneous  - in office rx given.   Special screening for malignant neoplasms, colon   - Plan: Cologuard  Hypertriglyceridemia  - return in few months with fasting labs.   Chronic low back pain without sciatica, unspecified back pain laterality  - handout given. Rare flare. Sx care discussed with RTC precautions.   No orders of the defined types were placed in this encounter.  Patient Instructions    Loratadine up to daily if needed  for allergies. Option of adding flonase if needed.  Follow up if those are not effective for your symptoms.   1st shingles vaccine today - 2nd one due in 2-6 months.   Occasional tylenol or advil if needed for occasional back pain, but I would like to meet in next few months to discuss back further. Fasting labs at next visit.   Thanks for coming in today.  Return to the clinic or go to the nearest emergency room if any of your symptoms worsen or new symptoms occur.   Chronic Back Pain When back pain lasts longer than 3 months, it is called chronic back pain.The cause of your back pain may not be known. Some common causes include:  Wear and tear (degenerative disease) of the bones, ligaments, or disks in your back.  Inflammation and stiffness in your back (arthritis). People who have chronic back pain often go through certain periods in which the pain is more intense (flare-ups). Many people can learn to manage the pain with home care. Follow these instructions at home: Pay attention to any changes in your symptoms. Take these actions to help with your pain: Activity   Avoid bending and other activities that make the problem worse.  Maintain a proper position when standing or sitting: ? When standing, keep your upper back and neck straight, with your shoulders pulled back. Avoid  slouching. ? When sitting, keep your back straight and relax your shoulders. Do not round your shoulders or pull them backward.  Do not sit or stand in one place for long periods of time.  Take brief periods of rest throughout the day. This will reduce your pain. Resting in a lying or standing position is usually better than sitting to rest.  When you are resting for longer periods, mix in some mild activity or stretching between periods of rest. This will help to prevent stiffness and pain.  Get regular exercise. Ask your health care provider what activities are safe for you.  Do not lift anything that is heavier than 10 lb (4.5 kg). Always use proper lifting technique, which includes: ? Bending your knees. ? Keeping the load close to your body. ? Avoiding twisting.  Sleep on a firm mattress in a comfortable position. Try lying on your side with your knees slightly bent. If you lie on your back, put a pillow under your knees. Managing pain  If directed, apply ice to the painful area. Your health care provider may recommend applying ice during the first 24-48 hours after a flare-up begins. ? Put ice in a plastic bag. ? Place a towel between your skin and the bag. ? Leave the ice on for 20 minutes, 2-3 times per day.  If directed, apply heat to the affected area as often as told by your health care provider. Use the heat source that your health care provider recommends, such as a moist heat pack or a heating pad. ? Place a towel between your skin and the heat source. ? Leave the heat on for 20-30 minutes. ? Remove the heat if your skin turns bright red. This is especially important if you are unable to feel pain, heat, or cold. You may have a greater risk of getting burned.  Try soaking in a warm tub.  Take over-the-counter and prescription medicines only as told by your health care provider.  Keep all follow-up visits as told by your health care provider. This is important. Contact  a health care provider if:  You have pain that is not relieved with rest or medicine. Get help right away if:  You have weakness or numbness in one or both of your legs or feet.  You have trouble controlling your bladder or your bowels.  You have nausea or vomiting.  You have pain in your abdomen.  You have shortness of breath or you faint. This information is not intended to replace advice given to you by your health care provider. Make sure you discuss any questions you have with your health care provider. Document Revised: 07/20/2018 Document Reviewed: 10/06/2016 Elsevier Patient Education  [employer].   If you have lab work done today you will be contacted with your lab results within the next 2 weeks.  If you have not heard from [place] then please contact us. The fastest way to get your results is to register for My Chart.   IF you received an x-ray today, you will receive an invoice from [facility]. Please contact [facility] at [phone] with questions or concerns regarding your invoice.   IF you received labwork today, you will receive an invoice from [place]. Please contact [facility] at [phone] with questions or concerns regarding your invoice.   Our billing staff will not be able to assist you with questions regarding bills from these companies.  You will be contacted with the lab results as soon as they are available. The fastest way to get your results is to activate your My Chart account. Instructions are located on the last page of this paperwork. If you have not heard from [place] regarding the results in 2 weeks, please contact this office.          Signed, Meredith Staggers, MD [facility]

## 2020-03-05 LAB — COLOGUARD: Cologuard: NEGATIVE

## 2021-11-22 ENCOUNTER — Encounter (HOSPITAL_COMMUNITY): Payer: Self-pay | Admitting: Emergency Medicine

## 2021-11-22 ENCOUNTER — Ambulatory Visit (HOSPITAL_COMMUNITY)
Admission: EM | Admit: 2021-11-22 | Discharge: 2021-11-22 | Disposition: A | Discharge status: Home / Self Care | Payer: Managed Care, Other (non HMO) | Attending: Family Medicine | Admitting: Family Medicine

## 2021-11-22 DIAGNOSIS — M25561 Pain in right knee: Secondary | ICD-10-CM

## 2021-11-22 MED ORDER — NAPROXEN 500 MG PO TABS: 500.0000 mg | ORAL_TABLET | Freq: Two times a day (BID) | ORAL | 0 refills | Status: AC | PRN

## 2021-11-22 MED ORDER — KETOROLAC TROMETHAMINE 30 MG/ML IJ SOLN
INTRAMUSCULAR | Status: AC
  Filled 2021-11-22: qty 1

## 2021-11-22 MED ORDER — KETOROLAC TROMETHAMINE 30 MG/ML IJ SOLN
30.0000 mg | Freq: Once | INTRAMUSCULAR | Status: AC
  Administered 2021-11-22: 30 mg via INTRAMUSCULAR

## 2021-11-22 NOTE — Discharge Instructions (Addendum)
You have been given a shot of Toradol 30 mg today.  Take naproxen 500 mg--1 tablet every 12 hours as needed for pain  Do not take ibuprofen while you are taking the naproxen  You can use the QR code/website at the back of the summary paperwork to schedule yourself a new patient appointment with primary care

## 2021-11-22 NOTE — ED Triage Notes (Signed)
Pt c/o right leg pain that has been ongoing for months. Reports missed work today. Denies injury. Bending without any difficulty during triage. Reports right knee hasnt popped went bent down like left knee does.

## 2021-11-22 NOTE — ED Provider Notes (Addendum)
[facility]    CSN: [id number] Arrival date & time: 11/22/21  1745      History   Chief Complaint Chief Complaint  Patient presents with   Leg Pain    HPI James Cain. is a 56 y.o. male.    Leg Pain  Was several months history of right knee pain.  It bothered him especially over the last day or so.  No recent trauma and has not fallen onto that knee.  It does not lock.  He does note that sometimes it will have crepitus in it.  No swelling.  Every morning he takes some Tylenol and 800 mg of ibuprofen.  He does not have a primary care doctor    Past Medical History:  Diagnosis Date   Arthritis     Patient Active Problem List   Diagnosis Date Noted   Spondylosis without myelopathy or radiculopathy, cervical region 03/08/2017   Other intervertebral disc degeneration, lumbar region 03/08/2017    Past Surgical History:  Procedure Laterality Date   EYE SURGERY         Home Medications    Prior to Admission medications   Medication Sig Start Date End Date Taking? Authorizing Provider  naproxen (NAPROSYN) 500 MG tablet Take 1 tablet (500 mg total) by mouth 2 (two) times daily as needed (pain). 11/22/21  Yes Zenia Resides, MD  acetaminophen (TYLENOL) 500 MG tablet Take 1,000 mg by mouth 2 (two) times daily.    [provider]  loratadine (CLARITIN) 10 MG tablet Take 10 mg by mouth daily.    [provider]  Misc Natural Products (GLUCOSAMINE CHOND DOUBLE STR) TABS Take 1 tablet by mouth daily.    [provider]  Multiple Vitamins-Minerals (MULTIVITAMIN ADULT PO) Take 1 tablet by mouth daily.    [provider]  UNABLE TO FIND Curamin for pain    [provider]  UNABLE TO FIND Med Name:heal and sooth    [provider]    Family History Family History  Problem Relation Age of Onset   Stroke Mother    Heart disease Paternal Grandmother    Hyperlipidemia Paternal Grandmother     Social  History Social History   Tobacco Use   Smoking status: Former   Smokeless tobacco: Never  Substance Use Topics   Alcohol use: No    Alcohol/week: 0.0 standard drinks of alcohol   Drug use: No     Allergies   Patient has no known allergies.   Review of Systems Review of Systems   Physical Exam Triage Vital Signs ED Triage Vitals  Enc Vitals Group     BP 11/22/21 1832 (!) 168/85     Pulse Rate 11/22/21 1832 72     Resp 11/22/21 1832 18     Temp 11/22/21 1832 98.3 F (36.8 C)     Temp Source 11/22/21 1832 Oral     SpO2 11/22/21 1832 100 %     Weight --      Height --      Head Circumference --      Peak Flow --      Pain Score 11/22/21 1829 7     Pain Loc --      Pain Edu? --      Excl. in GC? --    No data found.  Updated Vital Signs BP (!) 168/85 (BP Location: Right Arm)   Pulse 72   Temp 98.3 F (36.8 C) (  Oral)   Resp 18   SpO2 100%   Visual Acuity Right Eye Distance:   Left Eye Distance:   Bilateral Distance:    Right Eye Near:   Left Eye Near:    Bilateral Near:     Physical Exam Vitals reviewed.  Constitutional:      General: He is not in acute distress.    Appearance: He is not ill-appearing, toxic-appearing or diaphoretic.  Musculoskeletal:        General: No swelling, tenderness, deformity or signs of injury.     Right lower leg: No edema.     Comments: There is good range of motion of the right knee; in fact he squats here in the office while he is talking to me.  No edema or effusion.  No erythema or deformity.  Neurological:     General: No focal deficit present.     Mental Status: He is alert and oriented to person, place, and time.  Psychiatric:        Behavior: Behavior normal.      UC Treatments / Results  Labs (all labs ordered are listed, but only abnormal results are displayed) Labs Reviewed - No data to display  EKG   Radiology No results found.  Procedures Procedures (including critical care  time)  Medications Ordered in UC Medications  ketorolac (TORADOL) 30 MG/ML injection 30 mg (has no administration in time range)    Initial Impression / Assessment and Plan / UC Course  I have reviewed the triage vital signs and the nursing notes.  Pertinent labs & imaging results that were available during my care of the patient were reviewed by me and considered in my medical decision making (see chart for details).     We will send in some naproxen and we will give him a shot of Toradol tonight.  He is given contact information for orthopedics, and we will show him how to make a new patient appointment with primary care Final Clinical Impressions(s) / UC Diagnoses   Final diagnoses:  Acute pain of right knee     Discharge Instructions      You have been given a shot of Toradol 30 mg today.  Take naproxen 500 mg--1 tablet every 12 hours as needed for pain  Do not take ibuprofen while you are taking the naproxen  You can use the QR code/website at the back of the summary paperwork to schedule yourself a new patient appointment with primary care      ED Prescriptions     Medication Sig Dispense Auth. Provider   naproxen (NAPROSYN) 500 MG tablet Take 1 tablet (500 mg total) by mouth 2 (two) times daily as needed (pain). 30 tablet Sapphira Harjo, Janace Aris, MD      I have reviewed the PDMP during this encounter.   Zenia Resides, MD 11/22/21 1900    Zenia Resides, MD 11/22/21 1901
